# Patient Record
Sex: Female | Born: 1985
Health system: Southern US, Community
[De-identification: ages and names within clinical notes are randomized; demographics above are authoritative.]

## PROBLEM LIST (undated history)

## (undated) DIAGNOSIS — Z789 Other specified health status: Secondary | ICD-10-CM

## (undated) HISTORY — PX: NO PAST SURGERIES: SHX2092

---

## 2008-02-22 ENCOUNTER — Inpatient Hospital Stay (HOSPITAL_COMMUNITY): Admission: AD | Admit: 2008-02-22 | Discharge: 2008-02-22 | Payer: Self-pay | Admitting: Obstetrics & Gynecology

## 2011-02-17 LAB — URINALYSIS, ROUTINE W REFLEX MICROSCOPIC
Bilirubin Urine: NEGATIVE
Glucose, UA: NEGATIVE
Ketones, ur: NEGATIVE
Nitrite: NEGATIVE
Protein, ur: NEGATIVE
Specific Gravity, Urine: 1.025
Urobilinogen, UA: 0.2
pH: 6

## 2011-02-17 LAB — WET PREP, GENITAL: Clue Cells Wet Prep HPF POC: NONE SEEN

## 2011-02-17 LAB — CBC
Hemoglobin: 13.7
MCV: 83.4
Platelets: 472 — ABNORMAL HIGH
RDW: 12.9

## 2011-02-17 LAB — DIFFERENTIAL
Eosinophils Absolute: 0.3
Monocytes Absolute: 0.6

## 2012-12-20 ENCOUNTER — Encounter (HOSPITAL_COMMUNITY): Payer: Self-pay | Admitting: *Deleted

## 2012-12-20 ENCOUNTER — Emergency Department (HOSPITAL_COMMUNITY)
Admission: AD | Admit: 2012-12-20 | Discharge: 2012-12-20 | Disposition: A | Discharge status: Home / Self Care | Payer: Managed Care, Other (non HMO) | Source: Ambulatory Visit | Attending: Emergency Medicine | Admitting: Emergency Medicine

## 2012-12-20 ENCOUNTER — Emergency Department (HOSPITAL_COMMUNITY): Payer: Managed Care, Other (non HMO)

## 2012-12-20 DIAGNOSIS — J40 Bronchitis, not specified as acute or chronic: Secondary | ICD-10-CM | POA: Insufficient documentation

## 2012-12-20 DIAGNOSIS — Z3202 Encounter for pregnancy test, result negative: Secondary | ICD-10-CM | POA: Insufficient documentation

## 2012-12-20 DIAGNOSIS — R7981 Abnormal blood-gas level: Secondary | ICD-10-CM

## 2012-12-20 DIAGNOSIS — R0602 Shortness of breath: Secondary | ICD-10-CM

## 2012-12-20 DIAGNOSIS — R062 Wheezing: Secondary | ICD-10-CM | POA: Insufficient documentation

## 2012-12-20 DIAGNOSIS — R000 Tachycardia, unspecified: Secondary | ICD-10-CM | POA: Insufficient documentation

## 2012-12-20 DIAGNOSIS — J9801 Acute bronchospasm: Secondary | ICD-10-CM

## 2012-12-20 HISTORY — DX: Other specified health status: Z78.9

## 2012-12-20 LAB — URINE MICROSCOPIC-ADD ON

## 2012-12-20 LAB — URINALYSIS, ROUTINE W REFLEX MICROSCOPIC
Glucose, UA: NEGATIVE mg/dL
Ketones, ur: NEGATIVE mg/dL
Nitrite: NEGATIVE
Urobilinogen, UA: 0.2 mg/dL (ref 0.0–1.0)

## 2012-12-20 LAB — D-DIMER, QUANTITATIVE: D-Dimer, Quant: 0.28 ug/mL-FEU (ref 0.00–0.48)

## 2012-12-20 LAB — GLUCOSE, CAPILLARY: Glucose-Capillary: 123 mg/dL — ABNORMAL HIGH (ref 70–99)

## 2012-12-20 LAB — CBC
HCT: 37.4 % (ref 36.0–46.0)
Hemoglobin: 13.3 g/dL (ref 12.0–15.0)
MCHC: 35.6 g/dL (ref 30.0–36.0)
MCV: 81.3 fL (ref 78.0–100.0)
RBC: 4.6 MIL/uL (ref 3.87–5.11)
RDW: 13.2 % (ref 11.5–15.5)

## 2012-12-20 LAB — POCT PREGNANCY, URINE: Preg Test, Ur: NEGATIVE

## 2012-12-20 LAB — BASIC METABOLIC PANEL
Chloride: 101 mEq/L (ref 96–112)
Creatinine, Ser: 0.49 mg/dL — ABNORMAL LOW (ref 0.50–1.10)
GFR calc Af Amer: >90 mL/min (ref >90)
Glucose, Bld: 105 mg/dL — ABNORMAL HIGH (ref 70–99)

## 2012-12-20 MED ORDER — IPRATROPIUM BROMIDE 0.02 % IN SOLN
RESPIRATORY_TRACT | Status: AC
  Administered 2012-12-20: 0.5 mg via RESPIRATORY_TRACT
  Filled 2012-12-20: qty 2.5

## 2012-12-20 MED ORDER — ALBUTEROL SULFATE (5 MG/ML) 0.5% IN NEBU
INHALATION_SOLUTION | RESPIRATORY_TRACT | Status: AC
  Administered 2012-12-20: 2.5 mg via RESPIRATORY_TRACT
  Filled 2012-12-20: qty 0.5

## 2012-12-20 MED ORDER — ALBUTEROL SULFATE (5 MG/ML) 0.5% IN NEBU
2.5000 mg | INHALATION_SOLUTION | Freq: Once | RESPIRATORY_TRACT | Status: AC
  Administered 2012-12-20: 2.5 mg via RESPIRATORY_TRACT

## 2012-12-20 MED ORDER — ALBUTEROL SULFATE HFA 108 (90 BASE) MCG/ACT IN AERS
2.0000 | INHALATION_SPRAY | Freq: Once | RESPIRATORY_TRACT | Status: AC
  Administered 2012-12-20: 2 via RESPIRATORY_TRACT
  Filled 2012-12-20 (×2): qty 6.7

## 2012-12-20 MED ORDER — ACETAMINOPHEN 325 MG PO TABS
650.0000 mg | ORAL_TABLET | Freq: Once | ORAL | Status: AC
  Administered 2012-12-20: 650 mg via ORAL
  Filled 2012-12-20: qty 2

## 2012-12-20 MED ORDER — ALBUTEROL SULFATE (5 MG/ML) 0.5% IN NEBU: 5.0000 mg | INHALATION_SOLUTION | Freq: Once | RESPIRATORY_TRACT | Status: DC

## 2012-12-20 MED ORDER — IPRATROPIUM BROMIDE 0.02 % IN SOLN
0.5000 mg | Freq: Once | RESPIRATORY_TRACT | Status: AC
  Administered 2012-12-20: 0.5 mg via RESPIRATORY_TRACT

## 2012-12-20 MED ORDER — SODIUM CHLORIDE 0.9 % IV SOLN
INTRAVENOUS | Status: DC
  Administered 2012-12-20: 06:00:00 via INTRAVENOUS

## 2012-12-20 NOTE — ED Provider Notes (Signed)
Medical screening examination/treatment/procedure(s) were performed by non-physician practitioner and as supervising physician I was immediately available for consultation/collaboration.    Shanna Cisco, MD 12/20/12 [phone]

## 2012-12-20 NOTE — ED Provider Notes (Signed)
Medical screening examination/treatment/procedure(s) were performed by non-physician practitioner and as supervising physician I was immediately available for consultation/collaboration.   1. SOB (shortness of breath)   2. Borderline low O2 saturation   3. Bronchitis   4. Bronchospasm      Shanna Cisco, MD 12/20/12 [phone]

## 2012-12-20 NOTE — ED Notes (Signed)
Pt ambulated sats remained in 96-98 o2 HR 93

## 2012-12-20 NOTE — MAU Provider Note (Signed)
History     CSN: [id number]  Arrival date and time: 12/20/12 4098   First Provider Initiated Contact with Patient 12/20/12 0501      No chief complaint on file.  HPI Ms. Bianca Ortiz is a 27 y.o. [phone] who presents to MAU today with complaint of upper abdominal pain and SOB. The patient states that the epigastric pain started ~ 5 pm last night. She then became short of breath and has had difficulty breathing since then. She attempted to lay down and wasn't able to breath well so she came in for evaluation. She denies fever, history of asthma or similar previous episode.   OB History   Grav Para Term Preterm Abortions TAB SAB Ect Mult Living   4 3 3  1 1    3       Past Medical History  Diagnosis Date  . Medical history non-contributory     Past Surgical History  Procedure Laterality Date  . No past surgeries      History reviewed. No pertinent family history.  History  Substance Use Topics  . Smoking status: Never Smoker   . Smokeless tobacco: Not on file  . Alcohol Use: Not on file    Allergies: No Known Allergies  No prescriptions prior to admission    Review of Systems  Constitutional: Negative for fever and malaise/fatigue.  Respiratory: Positive for shortness of breath and wheezing.   Gastrointestinal: Positive for abdominal pain.   Physical Exam   Blood pressure 106/66, pulse 109, temperature 98.5 F (36.9 C), resp. rate 18, height 4' 9.5" (1.461 m), weight 186 lb (84.369 kg), last menstrual period 11/15/2012, SpO2 100.00%.  Physical Exam  Constitutional: She is oriented to person, place, and time. She appears well-developed and well-nourished. She appears distressed.  HENT:  Head: Normocephalic and atraumatic.  Cardiovascular: Normal rate, regular rhythm and normal heart sounds.   Respiratory: Tachypnea noted. No respiratory distress. She has wheezes.  GI: Soft. She exhibits no distension and no mass. There is no tenderness. There is no rebound and  no guarding.  Neurological: She is alert and oriented to person, place, and time.  Skin: Skin is warm. She is diaphoretic. There is pallor.  Psychiatric: She has a normal mood and affect.   Results for orders placed during the hospital encounter of 12/20/12 (from the past 24 hour(s))  URINALYSIS, ROUTINE W REFLEX MICROSCOPIC     Status: Abnormal   Collection Time    12/20/12  4:44 AM      Result Value Range   Color, Urine YELLOW  YELLOW   APPearance HAZY (*) CLEAR   Specific Gravity, Urine >1.030 (*) 1.005 - 1.030   pH 6.0  5.0 - 8.0   Glucose, UA NEGATIVE  NEGATIVE mg/dL   Hgb urine dipstick MODERATE (*) NEGATIVE   Bilirubin Urine NEGATIVE  NEGATIVE   Ketones, ur NEGATIVE  NEGATIVE mg/dL   Protein, ur NEGATIVE  NEGATIVE mg/dL   Urobilinogen, UA 0.2  0.0 - 1.0 mg/dL   Nitrite NEGATIVE  NEGATIVE   Leukocytes, UA SMALL (*) NEGATIVE  URINE MICROSCOPIC-ADD ON     Status: Abnormal   Collection Time    12/20/12  4:44 AM      Result Value Range   Squamous Epithelial / LPF FEW (*) RARE   WBC, UA 3-6  <3 WBC/hpf   RBC / HPF 0-2  <3 RBC/hpf   Bacteria, UA FEW (*) RARE   Urine-Other MUCOUS PRESENT    POCT  PREGNANCY, URINE     Status: None   Collection Time    12/20/12  4:53 AM      Result Value Range   Preg Test, Ur NEGATIVE  NEGATIVE  GLUCOSE, CAPILLARY     Status: Abnormal   Collection Time    12/20/12  5:52 AM      Result Value Range   Glucose-Capillary 123 (*) 70 - 99 mg/dL    MAU Course  Procedures None  MDM 0500 - patient is alert and able to answer questions. O2 sat is 94% on room air. Nasal canula placed at 3L/min and O2 sats rise to 99% Patient received nebulizer treatment with albuterol and atrovent by RT. Patient reports some improvement in symptoms EKG ordered - sinus tachycardia noted CBG - 126 Patient reports improvement in SOB. Epigastric discomfort. Denies nausea Patient is weaned to 2 L O2 and maintains 100% O2 sat Patient is weaned off of O2 nasal canula  and maintains 98% O2 sat Patient now denies nausea or epigastric pain or SOB Patient on RA x ~ 15 minutes. Upon re-evaluation and talking with patient O2 sat dropped to 94% again. Replaced nasal canula on 2L/min Discussed patient with Dr. Lynelle Doctor at [facility]. OK for transfer for further evaluation and managament Assessment and Plan  A: SOB Low O2 saturation, unable to wean from O2 nasal canula  P: Transfer to [facility] for further evaluation and management  Freddi Starr, PA-C  12/20/2012, 6:06 AM

## 2012-12-20 NOTE — ED Notes (Signed)
Pt was transferred to this facility from [facility] hospital with sudden onset of  SHOB since 1700  On 8.4.14

## 2012-12-20 NOTE — MAU Note (Addendum)
PT SAYS SHE STARTED  HAVING UPPER ABD PAIN AT 5PM-  DENIES N/V.    COULD NOT SLEEP LYING DOWN-  HAD TO SIT UP.   NEVER HAPPENED BEFORE     SAYS HAD VAG BLEEDING 7-1  THRU 7-27.    NO HOME PREG TEST. LAST SEX-  7-17.   USES CONDOMS FOR BIRTH CONTROL.    SAYS HAS A HARD TIME BREATHING SINCE 5PM, NO COUGH, NO FEVER.    HAD CYCLE 7-1 TRU 5,  THEN HAD SPOTTING  ON 7-27.

## 2012-12-20 NOTE — ED Provider Notes (Signed)
CSN: [id number]     Arrival date & time 12/20/12  0426 History     First MD Initiated Contact with Patient 12/20/12 [phone]     Chief Complaint  Patient presents with  . Shortness of Breath   (Consider location/radiation/quality/duration/timing/severity/associated sxs/prior Treatment) HPI Comments: 27 y/o healthy female presents to the ED from [facility] complaining of sudden onset mid-epigastric pain and shortness of breath beginning around 5:00 pm last night. Shortness of breath remained constant, was unable to lay down to sleep and went to [facility] for evaluation. There she had two albuterol treatments which provided great relief. Denies chest pain, nausea, vomiting, lightheadedness, dizziness, calf pain or swelling. [facility] there was wheezing on exam, appeared in distress, O2 sat would drop to 94% despite treatment. Pregnancy test negative, GBC 123, UA without infection. EKG obtained at [facility] sinus tachycardia, otherwise normal. No history of asthma. Denies family history of blood clots. She does not take birth control pills. 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  Patient is a 27 y.o. female presenting with shortness of breath. The history is provided by the patient.  Shortness of Breath Associated symptoms: wheezing   Associated symptoms: no chest pain, no diaphoresis, no fever and no vomiting     Past Medical History  Diagnosis Date  . Medical history non-contributory    Past Surgical History  Procedure Laterality Date  . No past surgeries     History reviewed. No pertinent family history. History  Substance Use Topics  . Smoking status: Never Smoker   . Smokeless tobacco: Not on file  . Alcohol Use: Not on file   OB History   Grav Para Term Preterm Abortions TAB SAB Ect Mult Living   4 3 3  1 1    3      Review of Systems  Constitutional: Negative for fever, chills and  diaphoresis.  Respiratory: Positive for shortness of breath and wheezing. Negative for chest tightness.   Cardiovascular: Negative for chest pain.  Gastrointestinal: Negative for nausea and vomiting.  Musculoskeletal: Negative for back pain.  Neurological: Negative for dizziness, weakness and light-headedness.  All other systems reviewed and are negative.    Allergies  Review of patient's allergies indicates no known allergies.  Home Medications  No current outpatient prescriptions on file. BP 131/66  Pulse 97  Temp(Src) 98.4 F (36.9 C) (Oral)  Resp 16  Ht 4\' 9"  (1.448 m)  Wt 186 lb (84.369 kg)  BMI 40.24 kg/m2  SpO2 100%  LMP 11/15/2012 Physical Exam  Nursing note and vitals reviewed. Constitutional: She is oriented to person, place, and time. She appears well-developed and well-nourished. No distress. Nasal cannula in place.  HENT:  Head: Normocephalic and atraumatic.  Mouth/Throat: Oropharynx is clear and moist.  Eyes: Conjunctivae and EOM are normal. Pupils are equal, round, and reactive to light.  Neck: Normal range of motion. Neck supple. No tracheal deviation present.  Cardiovascular: Regular rhythm and normal heart sounds.  Tachycardia present.   Pulmonary/Chest: Effort normal. Not tachypneic. No respiratory distress. She has no decreased breath sounds. She has no wheezes. She has no rhonchi. She has no rales.  Abdominal: Soft. Bowel sounds are normal. She exhibits no distension. There is no tenderness.  Musculoskeletal: Normal range of motion. She exhibits no edema.  Neurological: She is alert and oriented to person, place, and time.  Skin: Skin is warm  and dry. She is not diaphoretic. No pallor.  Psychiatric: She has a normal mood and affect. Her behavior is normal.    ED Course   Procedures (including critical care time)  Labs Reviewed  URINALYSIS, ROUTINE W REFLEX MICROSCOPIC - Abnormal; Notable for the following:    APPearance HAZY (*)    Specific  Gravity, Urine >1.030 (*)    Hgb urine dipstick MODERATE (*)    Leukocytes, UA SMALL (*)    All other components within normal limits  URINE MICROSCOPIC-ADD ON - Abnormal; Notable for the following:    Squamous Epithelial / LPF FEW (*)    Bacteria, UA FEW (*)    All other components within normal limits  GLUCOSE, CAPILLARY - Abnormal; Notable for the following:    Glucose-Capillary 123 (*)    All other components within normal limits  CBC - Abnormal; Notable for the following:    WBC 16.2 (*)    All other components within normal limits  BASIC METABOLIC PANEL - Abnormal; Notable for the following:    Glucose, Bld 105 (*)    Creatinine, Ser 0.49 (*)    All other components within normal limits  URINE CULTURE  D-DIMER, QUANTITATIVE  POCT PREGNANCY, URINE    Dg Chest 2 View  12/20/2012   *RADIOLOGY REPORT*  Clinical Data: Shortness of breath.  CHEST - 2 VIEW  Comparison: None  Findings: Heart and mediastinal contours are within normal limits. No focal opacities or effusions.  No acute bony abnormality.  Mild central peribronchial thickening.  IMPRESSION: Mild bronchitic changes.   Original Report Authenticated By: Charlett Nose, M.D.   Dx: bronchitis, bronchospasm.  MDM  Patient transferred from [facility] hospital with SOB. Tachycardia and tachypnea noted at [facility], tachypnea resolved prior to transfer. O2 sat on 2L 99%. She appears in NAD, speaking in full sentences. Lungs clear. Recent long car ride to the beach. Labs pending- d-dimer, cbc, bmp. Will get CXR. Possible PE vs bronchospasm.  8:50 AM D-dimer negative. Chest x-ray showing mild bronchitic changes. Patient is feeling much better. O2 sat on room air 100%. I will have the patient walked and check her O2 sat at that time. If she is able to tolerate, will be discharged home. WIll discharge with albuterol inhaler. 9:13 AM O2 sat 97-99% on RA while talking around the ED. She is stable for discharge. Normal vital signs. Close return  precautions given. Conservative measures discussed. Patient states understanding of treatment care plan and is agreeable.   Trevor Mace, PA-C 12/20/12 [phone]

## 2012-12-21 LAB — URINE CULTURE: Colony Count: >=100000

## 2012-12-21 NOTE — MAU Provider Note (Signed)
Attestation of Attending Supervision of Advanced Practitioner (CNM/NP): Evaluation and management procedures were performed by the Advanced Practitioner under my supervision and collaboration.  I have reviewed the Advanced Practitioner's note and chart, and I agree with the management and plan.  Neymar Dowe 12/21/2012 11:01 AM

## 2013-02-20 ENCOUNTER — Other Ambulatory Visit (HOSPITAL_COMMUNITY): Payer: Self-pay | Admitting: Physician Assistant

## 2013-02-20 DIAGNOSIS — Z3689 Encounter for other specified antenatal screening: Secondary | ICD-10-CM

## 2013-02-20 LAB — OB RESULTS CONSOLE RPR: RPR: NONREACTIVE

## 2013-02-20 LAB — OB RESULTS CONSOLE ANTIBODY SCREEN: Antibody Screen: NEGATIVE

## 2013-02-20 LAB — OB RESULTS CONSOLE ABO/RH: RH TYPE: POSITIVE

## 2013-02-20 LAB — OB RESULTS CONSOLE RUBELLA ANTIBODY, IGM: RUBELLA: IMMUNE

## 2013-02-20 LAB — OB RESULTS CONSOLE HEPATITIS B SURFACE ANTIGEN: Hepatitis B Surface Ag: NEGATIVE

## 2013-02-20 LAB — OB RESULTS CONSOLE HIV ANTIBODY (ROUTINE TESTING): HIV: NONREACTIVE

## 2013-04-24 ENCOUNTER — Ambulatory Visit (HOSPITAL_COMMUNITY)
Admission: RE | Admit: 2013-04-24 | Discharge: 2013-04-24 | Disposition: A | Discharge status: Home / Self Care | Payer: Managed Care, Other (non HMO) | Source: Ambulatory Visit | Attending: Physician Assistant | Admitting: Physician Assistant

## 2013-04-24 DIAGNOSIS — Z3689 Encounter for other specified antenatal screening: Secondary | ICD-10-CM | POA: Insufficient documentation

## 2013-05-26 ENCOUNTER — Other Ambulatory Visit (HOSPITAL_COMMUNITY): Payer: Self-pay | Admitting: Physician Assistant

## 2013-05-26 DIAGNOSIS — IMO0002 Reserved for concepts with insufficient information to code with codable children: Secondary | ICD-10-CM

## 2013-05-26 DIAGNOSIS — Z0489 Encounter for examination and observation for other specified reasons: Secondary | ICD-10-CM

## 2013-05-29 ENCOUNTER — Encounter (HOSPITAL_COMMUNITY): Payer: Self-pay

## 2013-05-29 ENCOUNTER — Ambulatory Visit (HOSPITAL_COMMUNITY)
Admission: RE | Admit: 2013-05-29 | Discharge: 2013-05-29 | Disposition: A | Discharge status: Home / Self Care | Payer: Medicaid Other | Source: Ambulatory Visit | Attending: Physician Assistant | Admitting: Physician Assistant

## 2013-05-29 DIAGNOSIS — Z0489 Encounter for examination and observation for other specified reasons: Secondary | ICD-10-CM

## 2013-05-29 DIAGNOSIS — E669 Obesity, unspecified: Secondary | ICD-10-CM | POA: Insufficient documentation

## 2013-05-29 DIAGNOSIS — IMO0002 Reserved for concepts with insufficient information to code with codable children: Secondary | ICD-10-CM

## 2013-05-29 DIAGNOSIS — O99215 Obesity complicating the puerperium: Principal | ICD-10-CM

## 2013-05-30 ENCOUNTER — Encounter: Payer: Self-pay | Admitting: *Deleted

## 2013-05-31 ENCOUNTER — Other Ambulatory Visit (HOSPITAL_COMMUNITY): Payer: Self-pay | Admitting: Physician Assistant

## 2013-05-31 DIAGNOSIS — Z3689 Encounter for other specified antenatal screening: Secondary | ICD-10-CM

## 2013-06-26 ENCOUNTER — Other Ambulatory Visit (HOSPITAL_COMMUNITY): Payer: Self-pay | Admitting: Physician Assistant

## 2013-06-26 ENCOUNTER — Ambulatory Visit (HOSPITAL_COMMUNITY)
Admission: RE | Admit: 2013-06-26 | Discharge: 2013-06-26 | Disposition: A | Discharge status: Home / Self Care | Payer: Medicaid Other | Source: Ambulatory Visit | Attending: Physician Assistant | Admitting: Physician Assistant

## 2013-06-26 DIAGNOSIS — Z3689 Encounter for other specified antenatal screening: Secondary | ICD-10-CM | POA: Insufficient documentation

## 2013-06-26 DIAGNOSIS — E669 Obesity, unspecified: Secondary | ICD-10-CM | POA: Insufficient documentation

## 2013-06-26 DIAGNOSIS — O2690 Pregnancy related conditions, unspecified, unspecified trimester: Secondary | ICD-10-CM

## 2013-06-26 DIAGNOSIS — O99210 Obesity complicating pregnancy, unspecified trimester: Principal | ICD-10-CM

## 2013-06-26 NOTE — Progress Notes (Signed)
MFM ultrasound  Indication: 28 yr old [id number] at 70108w0d for follow up ultrasound to complete fetal anatomy. Remote read.  Findings: 1. Single intrauterine pregnancy. 2. Estimated fetal weight is in the 70th%. 3. Posterior placenta without evidence of previa. 4. Normal amniotic fluid volume. 5. Normal transabdominal cervical length. 6. The views of the heart and lips remain limited. 7. The remainder of the limited anatomy survey is normal.  Recommendations: 1. Appropriate fetal growth. 2. Limited anatomy survey: - recommend follow up ultrasound in the [facility] so an MFM can scan or alternatively may obtain a fetal echocardiogram to complete heart anatomy - if views cannot be obtained at that point do not recommend any follow up ultrasounds as it is unlikely views will be completed late in gestation - recommend discuss with patient the limitations of ultrasound in detecting fetal anomalies  Bianca FosterKristen Daphne Karrer, MD

## 2013-06-27 ENCOUNTER — Ambulatory Visit (HOSPITAL_COMMUNITY)
Admission: RE | Admit: 2013-06-27 | Discharge: 2013-06-27 | Disposition: A | Discharge status: Home / Self Care | Payer: Medicaid Other | Source: Ambulatory Visit | Attending: Physician Assistant | Admitting: Physician Assistant

## 2013-06-27 ENCOUNTER — Other Ambulatory Visit (HOSPITAL_COMMUNITY): Payer: Self-pay | Admitting: Obstetrics and Gynecology

## 2013-06-27 DIAGNOSIS — O99210 Obesity complicating pregnancy, unspecified trimester: Principal | ICD-10-CM

## 2013-06-27 DIAGNOSIS — O2690 Pregnancy related conditions, unspecified, unspecified trimester: Secondary | ICD-10-CM

## 2013-06-27 DIAGNOSIS — Z3689 Encounter for other specified antenatal screening: Secondary | ICD-10-CM | POA: Insufficient documentation

## 2013-06-27 DIAGNOSIS — E669 Obesity, unspecified: Secondary | ICD-10-CM | POA: Insufficient documentation

## 2013-07-09 ENCOUNTER — Inpatient Hospital Stay (HOSPITAL_COMMUNITY): Payer: Medicaid Other

## 2013-07-09 ENCOUNTER — Encounter (HOSPITAL_COMMUNITY): Payer: Self-pay

## 2013-07-09 ENCOUNTER — Inpatient Hospital Stay (HOSPITAL_COMMUNITY)
Admission: AD | Admit: 2013-07-09 | Discharge: 2013-07-16 | DRG: 765 | Disposition: A | Discharge status: Home / Self Care | Payer: Medicaid Other | Source: Ambulatory Visit | Attending: Obstetrics & Gynecology | Admitting: Obstetrics & Gynecology

## 2013-07-09 DIAGNOSIS — O99214 Obesity complicating childbirth: Secondary | ICD-10-CM

## 2013-07-09 DIAGNOSIS — E669 Obesity, unspecified: Secondary | ICD-10-CM | POA: Diagnosis present

## 2013-07-09 DIAGNOSIS — O321XX0 Maternal care for breech presentation, not applicable or unspecified: Principal | ICD-10-CM | POA: Diagnosis present

## 2013-07-09 DIAGNOSIS — IMO0002 Reserved for concepts with insufficient information to code with codable children: Secondary | ICD-10-CM | POA: Diagnosis present

## 2013-07-09 DIAGNOSIS — O4290 Premature rupture of membranes, unspecified as to length of time between rupture and onset of labor, unspecified weeks of gestation: Secondary | ICD-10-CM | POA: Diagnosis present

## 2013-07-09 DIAGNOSIS — O42913 Preterm premature rupture of membranes, unspecified as to length of time between rupture and onset of labor, third trimester: Secondary | ICD-10-CM

## 2013-07-09 DIAGNOSIS — O411090 Infection of amniotic sac and membranes, unspecified, unspecified trimester, not applicable or unspecified: Secondary | ICD-10-CM | POA: Diagnosis present

## 2013-07-09 LAB — URINALYSIS, ROUTINE W REFLEX MICROSCOPIC
Bilirubin Urine: NEGATIVE
Bilirubin Urine: NEGATIVE
GLUCOSE, UA: NEGATIVE mg/dL
GLUCOSE, UA: NEGATIVE mg/dL
KETONES UR: NEGATIVE mg/dL
Ketones, ur: 40 mg/dL — AB
LEUKOCYTES UA: NEGATIVE
NITRITE: NEGATIVE
Nitrite: NEGATIVE
PH: 6 (ref 5.0–8.0)
PROTEIN: NEGATIVE mg/dL
Protein, ur: NEGATIVE mg/dL
Specific Gravity, Urine: 1.015 (ref 1.005–1.030)
Urobilinogen, UA: 0.2 mg/dL (ref 0.0–1.0)
Urobilinogen, UA: 0.2 mg/dL (ref 0.0–1.0)
pH: 7 (ref 5.0–8.0)

## 2013-07-09 LAB — URINE MICROSCOPIC-ADD ON

## 2013-07-09 LAB — RPR: RPR Ser Ql: NONREACTIVE

## 2013-07-09 LAB — RAPID URINE DRUG SCREEN, HOSP PERFORMED
Amphetamines: NOT DETECTED
BARBITURATES: NOT DETECTED
Benzodiazepines: NOT DETECTED
Cocaine: NOT DETECTED
Opiates: NOT DETECTED
TETRAHYDROCANNABINOL: NOT DETECTED

## 2013-07-09 LAB — CBC
HCT: 32.1 % — ABNORMAL LOW (ref 36.0–46.0)
Hemoglobin: 10.9 g/dL — ABNORMAL LOW (ref 12.0–15.0)
MCH: 27.7 pg (ref 26.0–34.0)
MCHC: 34 g/dL (ref 30.0–36.0)
MCV: 81.7 fL (ref 78.0–100.0)
PLATELETS: 287 10*3/uL (ref 150–400)
RBC: 3.93 MIL/uL (ref 3.87–5.11)
RDW: 13.7 % (ref 11.5–15.5)
WBC: 14.8 10*3/uL — AB (ref 4.0–10.5)

## 2013-07-09 LAB — WET PREP, GENITAL
CLUE CELLS WET PREP: NONE SEEN
Trich, Wet Prep: NONE SEEN
Yeast Wet Prep HPF POC: NONE SEEN

## 2013-07-09 LAB — ABO/RH: ABO/RH(D): B POS

## 2013-07-09 LAB — POCT FERN TEST: POCT FERN TEST: POSITIVE — AB

## 2013-07-09 MED ORDER — AMOXICILLIN 500 MG PO CAPS
500.0000 mg | ORAL_CAPSULE | Freq: Three times a day (TID) | ORAL | Status: DC
  Administered 2013-07-11 – 2013-07-12 (×5): 500 mg via ORAL
  Filled 2013-07-09 (×7): qty 1

## 2013-07-09 MED ORDER — ACETAMINOPHEN 325 MG PO TABS: 650.0000 mg | ORAL_TABLET | ORAL | Status: DC | PRN

## 2013-07-09 MED ORDER — ZOLPIDEM TARTRATE 5 MG PO TABS: 5.0000 mg | ORAL_TABLET | Freq: Every evening | ORAL | Status: DC | PRN

## 2013-07-09 MED ORDER — PRENATAL MULTIVITAMIN CH
1.0000 | ORAL_TABLET | Freq: Every day | ORAL | Status: DC
  Administered 2013-07-09 – 2013-07-12 (×4): 1 via ORAL
  Filled 2013-07-09 (×6): qty 1

## 2013-07-09 MED ORDER — DOCUSATE SODIUM 100 MG PO CAPS
100.0000 mg | ORAL_CAPSULE | Freq: Every day | ORAL | Status: DC
  Administered 2013-07-09 – 2013-07-12 (×4): 100 mg via ORAL
  Filled 2013-07-09 (×6): qty 1

## 2013-07-09 MED ORDER — LACTATED RINGERS IV SOLN
INTRAVENOUS | Status: DC
  Administered 2013-07-09 – 2013-07-11 (×4): via INTRAVENOUS

## 2013-07-09 MED ORDER — SODIUM CHLORIDE 0.9 % IV SOLN
2.0000 g | Freq: Four times a day (QID) | INTRAVENOUS | Status: AC
  Administered 2013-07-09 – 2013-07-11 (×8): 2 g via INTRAVENOUS
  Filled 2013-07-09 (×8): qty 2000

## 2013-07-09 MED ORDER — MAGNESIUM SULFATE BOLUS VIA INFUSION
4.0000 g | Freq: Once | INTRAVENOUS | Status: AC
  Administered 2013-07-09: 4 g via INTRAVENOUS
  Filled 2013-07-09: qty 500

## 2013-07-09 MED ORDER — SODIUM CHLORIDE 0.9 % IV SOLN
250.0000 mg | Freq: Four times a day (QID) | INTRAVENOUS | Status: AC
  Administered 2013-07-09 – 2013-07-11 (×8): 250 mg via INTRAVENOUS
  Filled 2013-07-09 (×8): qty 5

## 2013-07-09 MED ORDER — ERYTHROMYCIN BASE 250 MG PO TABS
250.0000 mg | ORAL_TABLET | Freq: Four times a day (QID) | ORAL | Status: DC
  Administered 2013-07-11 – 2013-07-12 (×6): 250 mg via ORAL
  Filled 2013-07-09 (×9): qty 1

## 2013-07-09 MED ORDER — CALCIUM CARBONATE ANTACID 500 MG PO CHEW
2.0000 | CHEWABLE_TABLET | ORAL | Status: DC | PRN
  Filled 2013-07-09: qty 2

## 2013-07-09 MED ORDER — MAGNESIUM SULFATE 40 G IN LACTATED RINGERS - SIMPLE
2.0000 g/h | INTRAVENOUS | Status: AC
  Filled 2013-07-09: qty 500

## 2013-07-09 MED ORDER — BETAMETHASONE SOD PHOS & ACET 6 (3-3) MG/ML IJ SUSP
12.0000 mg | INTRAMUSCULAR | Status: AC
  Administered 2013-07-09 – 2013-07-10 (×2): 12 mg via INTRAMUSCULAR
  Filled 2013-07-09 (×2): qty 2

## 2013-07-09 NOTE — H&P (Signed)
HPI: Bianca Ortiz is a 28 y.o. year old G49P3013 female at [redacted]w[redacted]d weeks gestation be LMP, verified by 36  Week [place] who presents to MAU reporting spotting and leaking clear mucus x 2 days. Sx started after IC. Upon Speculum exam she was found to have ruptured membranes w/ scant, pink bloody show. She has been getting care at [facility]. No complications. Hx term SVD x 3.   Maternal Medical History:  Reason for admission: Rupture of membranes and vaginal bleeding.  Nausea.  Contractions: Onset was 2 days ago.      Problem list: PPROM 29.6 Varicella  NI  OB History   Grav Para Term Preterm Abortions TAB SAB Ect Mult Living   5 3 3  1 1    3      Past Medical History  Diagnosis Date  . Medical history non-contributory    Past Surgical History  Procedure Laterality Date  . No past surgeries     Family History: family history is not on file. Social History:  reports that she has never smoked. She does not have any smokeless tobacco history on file. She reports that she does not use illicit drugs. Her alcohol history is not on file.   Prenatal Transfer Tool  Maternal Diabetes: No Genetic Screening: Declined Maternal Ultrasounds/Referrals: Normal Fetal Ultrasounds or other Referrals:  None Maternal Substance Abuse:  No Significant Maternal Medications:  None Significant Maternal Lab Results:  None  Other Comments:  PPROM at 29.6 weeks. Amp/Erythro/BMZ/Mag, GBS pending  Review of Systems  Constitutional: Negative for fever and chills.  Eyes: Negative for blurred vision and double vision.  Gastrointestinal: Negative for nausea, vomiting and abdominal pain.  Genitourinary: Negative for dysuria, urgency, frequency, hematuria and flank pain.  Musculoskeletal: Negative for back pain.  Neurological: Negative for headaches.      Blood pressure 118/78, pulse 100, temperature 98.2 F (36.8 C), resp. rate 18, last menstrual period 12/12/2012. Maternal Exam:  Uterine Assessment: Contraction  strength is mild.  Contraction frequency is rare.   Abdomen: Fundal height is S=D.    Introitus: Normal vulva. Vulva is negative for lesion.  Ferning test: positive.  Amniotic fluid character: clear. Pos pooling of clear fluid mixed w/ mucus and pink blood. Cervix visually closed. Non-friable.  Cervix: Cervix evaluated by sterile speculum exam.     Fetal Exam Fetal Monitor Review: Mode: ultrasound.   Baseline rate: 150.  Variability: moderate (6-25 bpm).   Pattern: accelerations present and no decelerations.    Fetal State Assessment: Category I - tracings are normal.     Physical Exam  Nursing note and vitals reviewed. Constitutional: She is oriented to person, place, and time. She appears well-developed and well-nourished. No distress.  HENT:  Head: Normocephalic.  Eyes: Conjunctivae are normal.  Cardiovascular: Normal rate, regular rhythm and normal heart sounds.   Respiratory: Effort normal and breath sounds normal.  GI: Soft. There is no tenderness.  Genitourinary: Vulva exhibits no lesion.  Musculoskeletal: Normal range of motion. She exhibits no edema and no tenderness.  Neurological: She is alert and oriented to person, place, and time. She has normal reflexes.  Skin: Skin is warm and dry.  Psychiatric: She has a normal mood and affect.    Prenatal labs: ABO, Rh:  B Pos Antibody:  Neg Rubella:  Immune RPR:   NR HBsAg:   Neg HIV:   NR GBS:   Pending Quad screen ? Drawn 04/17/2014 Early 1 hour GTT 73 ? 28 week GTT  Assessment:  1. Labor: None. PPROM 2. Fetal Wellbeing: Category I  3. Pain Control: None 4. GBS: Pending 5. 29.6 week IUP  Plan:  1. Admit to Antenatal per consult with MD 2. Routine antenatal orders 3. Amp/Erythro 4. BMZ 5. Mag Sulfate for neuro prophylaxis 6. Plans BTL, but may want to discuss at time of delivery depending on baby's condition 7. NICU consult  Dorathy KinsmanSMITH, Vernecia Umble 07/09/2013, 2:03 PM

## 2013-07-09 NOTE — MAU Note (Signed)
Report called to antenatal. Pt will go to room 158

## 2013-07-09 NOTE — MAU Provider Note (Signed)
See H& P.

## 2013-07-09 NOTE — Progress Notes (Signed)
Notified of pt arrival in MAU and assessment. Will see pt

## 2013-07-09 NOTE — H&P (Signed)
Attestation of Attending Supervision of Advanced Practitioner (CNM/NP): Evaluation and management procedures were performed by the Advanced Practitioner under my supervision and collaboration. I have reviewed the Advanced Practitioner's note and chart, and I agree with the management and plan.  Bailynn Dyk H. 4:27 PM   

## 2013-07-09 NOTE — Progress Notes (Signed)
Neonatologist notified that the pt needs a NICU consult.

## 2013-07-09 NOTE — MAU Note (Signed)
Pt presents with complaints of bright, red vaginal bleeding on and off since intercourse 2 days ago. She is now experiencing cramping in her lower abdomen.

## 2013-07-09 NOTE — MAU Provider Note (Signed)
Attestation of Attending Supervision of Advanced Practitioner (CNM/NP): Evaluation and management procedures were performed by the Advanced Practitioner under my supervision and collaboration. I have reviewed the Advanced Practitioner's note and chart, and I agree with the management and plan.  Syncere Eble H. 4:29 PM   

## 2013-07-09 NOTE — Consult Note (Signed)
Neonatology Consult to Antenatal Patient: 07/09/2013 10:22 PM    I was requested by Dr. Penne LashLeggett to see this patient in order to provide antenatal counseling due to PPROM since 07/08/2013.    Ms.Kathan is a 28 y/o G5P3 who was admitted today and is now 29 6/[redacted] weeks GA.   She is currently "not" having active labor.  She is getting BMZ, Magnesium Sulfate and IV antibiotics (Ampicillin and Erythromycin).  EFW 1730 grams.  I spoke with Ms.Shimel in Room 158.   We discussed in detail what to expect in case of possible delivery of the infant in the next few days including morbidity and mortality at this gestational age, usual delivery room resuscitation including intubation and surfactant administration in the DR.  Discussed possible respiratory complications and need for support including mechanical ventilation, IV access, sepsis work-up, NG/OG feedings ( benefits of BF and MOB desires breast feeding, which was encouraged), risk for IVH with the potential for motor/cognitive deficits, length of stay and long-term outcome.  She had a few questions, which I answered.   I offered a NICU tour to any interested family members and would be glad to come back if she has more questions later.  Thank you for asking us to see this patient and allowing us to participate in her care.  Please call is there are any further questions.   Overton MamMary Ann T Nael Petrosyan, MD (Attending Neonatologist)  Total length of face-to-face or floor/unit time for this encounter was 25  minutes. Counseling and/or coordination of care was greater than fifty percent of the time.

## 2013-07-10 DIAGNOSIS — O4290 Premature rupture of membranes, unspecified as to length of time between rupture and onset of labor, unspecified weeks of gestation: Secondary | ICD-10-CM

## 2013-07-10 LAB — GC/CHLAMYDIA PROBE AMP
CT Probe RNA: NEGATIVE
GC Probe RNA: NEGATIVE

## 2013-07-10 NOTE — Progress Notes (Signed)
Ur chart review completed.  

## 2013-07-10 NOTE — Progress Notes (Signed)
Patient ID: Bianca Ortiz, female   DOB: [birth year], 28 y.o.   MRN: [id number] [place]) NOTE  Bianca Ortiz is a 28 y.o. [id number] at [redacted]w[redacted]d who is admitted for PPROM.   Length of Stay:  1  Days  Subjective: Patient reports the fetal movement as active. Patient reports uterine contraction  activity as none. Patient reports  vaginal bleeding as none. Patient describes fluid per vagina as Clear.  Vitals:  Blood pressure 97/58, pulse 96, temperature 98.1 F (36.7 C), temperature source Oral, resp. rate 18, height _0  (1.448 m), weight 196 lb (88.905 kg), last menstrual period 12/12/2012. Physical Examination:  General appearance - alert, well appearing, and in no distress Abdomen - soft, nontender, nondistended, no masses or organomegaly Extremities - Homan's sign negative bilaterally  Fetal Monitoring:  Baseline: 150 bpm, Variability: Good {> 6 bpm), Accelerations: Reactive and Decelerations: Absent  Labs:  Results for orders placed during the hospital encounter of 07/09/13 (from the past 24 hour(s))  URINALYSIS, ROUTINE W REFLEX MICROSCOPIC   Collection Time    07/09/13 11:00 AM      Result Value Ref Range   Color, Urine YELLOW  YELLOW   APPearance CLEAR  CLEAR   Specific Gravity, Urine 1.015  1.005 - 1.030   pH 7.0  5.0 - 8.0   Glucose, UA NEGATIVE  NEGATIVE mg/dL   Hgb urine dipstick MODERATE (*) NEGATIVE   Bilirubin Urine NEGATIVE  NEGATIVE   Ketones, ur NEGATIVE  NEGATIVE mg/dL   Protein, ur NEGATIVE  NEGATIVE mg/dL   Urobilinogen, UA 0.2  0.0 - 1.0 mg/dL   Nitrite NEGATIVE  NEGATIVE   Leukocytes, UA SMALL (*) NEGATIVE  URINE MICROSCOPIC-ADD ON   Collection Time    07/09/13 11:00 AM      Result Value Ref Range   Squamous Epithelial / LPF FEW (*) RARE   WBC, UA 11-20  <3 WBC/hpf   RBC / HPF 3-6  <3 RBC/hpf   Bacteria, UA MANY (*) RARE  WET PREP, GENITAL   Collection Time    07/09/13  1:04 PM      Result Value Ref Range   Yeast Wet  Prep HPF POC NONE SEEN  NONE SEEN   Trich, Wet Prep NONE SEEN  NONE SEEN   Clue Cells Wet Prep HPF POC NONE SEEN  NONE SEEN   WBC, Wet Prep HPF POC MANY (*) NONE SEEN  URINALYSIS, ROUTINE W REFLEX MICROSCOPIC   Collection Time    07/09/13  2:12 PM      Result Value Ref Range   Color, Urine YELLOW  YELLOW   APPearance CLEAR  CLEAR   Specific Gravity, Urine >1.030 (*) 1.005 - 1.030   pH 6.0  5.0 - 8.0   Glucose, UA NEGATIVE  NEGATIVE mg/dL   Hgb urine dipstick TRACE (*) NEGATIVE   Bilirubin Urine NEGATIVE  NEGATIVE   Ketones, ur 40 (*) NEGATIVE mg/dL   Protein, ur NEGATIVE  NEGATIVE mg/dL   Urobilinogen, UA 0.2  0.0 - 1.0 mg/dL   Nitrite NEGATIVE  NEGATIVE   Leukocytes, UA NEGATIVE  NEGATIVE  URINE MICROSCOPIC-ADD ON   Collection Time    07/09/13  2:12 PM      Result Value Ref Range   Squamous Epithelial / LPF FEW (*) RARE   WBC, UA 0-2  <3 WBC/hpf   RBC / HPF 0-2  <3 RBC/hpf   Bacteria, UA FEW (*) RARE   Urine-Other MUCOUS PRESENT  URINE RAPID DRUG SCREEN (HOSP PERFORMED)   Collection Time    07/09/13  2:12 PM      Result Value Ref Range   Opiates NONE DETECTED  NONE DETECTED   Cocaine NONE DETECTED  NONE DETECTED   Benzodiazepines NONE DETECTED  NONE DETECTED   Amphetamines NONE DETECTED  NONE DETECTED   Tetrahydrocannabinol NONE DETECTED  NONE DETECTED   Barbiturates NONE DETECTED  NONE DETECTED  RPR   Collection Time    07/09/13  2:20 PM      Result Value Ref Range   RPR NON REACTIVE  NON REACTIVE  CBC   Collection Time    07/09/13  3:10 PM      Result Value Ref Range   WBC 14.8 (*) 4.0 - 10.5 K/uL   RBC 3.93  3.87 - 5.11 MIL/uL   Hemoglobin 10.9 (*) 12.0 - 15.0 g/dL   HCT 32.1 (*) 36.0 - 46.0 %   MCV 81.7  78.0 - 100.0 fL   MCH 27.7  26.0 - 34.0 pg   MCHC 34.0  30.0 - 36.0 g/dL   RDW 13.7  11.5 - 15.5 %   Platelets 287  150 - 400 K/uL  TYPE AND SCREEN   Collection Time    07/09/13  3:10 PM      Result Value Ref Range   ABO/RH(D) B POS     Antibody  Screen NEG     Sample Expiration 07/12/2013    ABO/RH   Collection Time    07/09/13  3:10 PM      Result Value Ref Range   ABO/RH(D) B POS    POCT FERN TEST   Collection Time    07/09/13  4:16 PM      Result Value Ref Range   POCT Fern Test Positive = ruptured amniotic membanes (*)     Medications:  Scheduled . ampicillin (OMNIPEN) IV  2 g Intravenous Q6H   Followed by  . [START ON 07/11/2013] amoxicillin  500 mg Oral Q8H  . betamethasone acetate-betamethasone sodium phosphate  12 mg Intramuscular Q24H  . docusate sodium  100 mg Oral Daily  . erythromycin  250 mg Intravenous Q6H   Followed by  . [START ON 07/11/2013] erythromycin  250 mg Oral Q6H  . prenatal multivitamin  1 tablet Oral Q1200   I have reviewed the patient's current medications. Prior to Admission:  Prescriptions prior to admission  Medication Sig Dispense Refill  . Prenatal Vit-Fe Fumarate-FA (PRENATAL MULTIVITAMIN) TABS tablet Take 1 tablet by mouth daily at 12 noon.        ASSESSMENT: Patient Active Problem List   Diagnosis Date Noted  . Premature rupture of membranes in third trimester 07/09/2013    Plan: Bianca Ortiz is a 28 y.o. [id number] at [redacted]w[redacted]d who is admitted for PPROM.   1-Due for second dose of steroids 2-s/p Mag--no tocolytics at this time. 3-Pt has met with NICU 4-NST bid 5-Continue latency antibiotics.  Bianca Ortiz. 07/10/2013,6:19 AM

## 2013-07-10 NOTE — Plan of Care (Signed)
Problem: Consults Goal: Birthing Suites Patient Information Press F2 to bring up selections list  Outcome: Completed/Met Date Met:  07/10/13  Pt < [redacted] weeks EGA

## 2013-07-11 LAB — CULTURE, BETA STREP (GROUP B ONLY)

## 2013-07-11 MED ORDER — SODIUM CHLORIDE 0.9 % IJ SOLN: 3.0000 mL | Freq: Two times a day (BID) | INTRAMUSCULAR | Status: DC

## 2013-07-11 NOTE — Progress Notes (Signed)
Patient ID: Bianca Ortiz Gavina, female   DOB: 11/30/1985, 28 y.o.   MRN: [id number] FACULTY PRACTICE ANTEPARTUM(COMPREHENSIVE) NOTE  Bianca Ortiz Hickling is a 28 y.o. [id number] at 7153w1d  who is admitted for PPROM.   Length of Stay:  2  Days  Subjective: Patient reports the fetal movement as active. Patient reports uterine contraction  activity as none. Patient reports  vaginal bleeding as none. Patient describes fluid per vagina as Clear.  Vitals:  Blood pressure 102/51, pulse 72, temperature 97.9 F (36.6 C), temperature source Oral, resp. rate 16, height 4\' 9"  (1.448 m), weight 196 lb (88.905 kg), last menstrual period 12/12/2012. Physical Examination:  General appearance - alert, well appearing, and in no distress Abdomen - soft, nontender, nondistended, no masses or organomegaly Extremities - Homan's sign negative bilaterally  Fetal Monitoring:  Baseline: 150 bpm, Variability: Good {> 6 bpm), Accelerations: Reactive and Decelerations: Absent  Labs:  No results found for this or any previous visit (from the past 24 hour(s)).  Medications:  Scheduled . ampicillin (OMNIPEN) IV  2 g Intravenous Q6H   Followed by  . amoxicillin  500 mg Oral Q8H  . docusate sodium  100 mg Oral Daily  . erythromycin  250 mg Intravenous Q6H   Followed by  . erythromycin  250 mg Oral Q6H  . prenatal multivitamin  1 tablet Oral Q1200   I have reviewed the patient's current medications. Prior to Admission:  Prescriptions prior to admission  Medication Sig Dispense Refill  . Prenatal Vit-Fe Fumarate-FA (PRENATAL MULTIVITAMIN) TABS tablet Take 1 tablet by mouth daily at 12 noon.        ASSESSMENT: Patient Active Problem List   Diagnosis Date Noted  . Premature rupture of membranes in third trimester 07/09/2013    Plan: Bianca Ortiz Koslow is a 28 y.o. [id number] at 3253w1d  who is admitted for PPROM, s/p BMZ and MgSO4 Continue latency antibiotics Continue monitoring for si/sx of chorio Continue current antepartum  care  Emmaus Brandi 07/11/2013,6:56 AM

## 2013-07-12 LAB — TYPE AND SCREEN
ABO/RH(D): B POS
ABO/RH(D): B POS
ANTIBODY SCREEN: NEGATIVE
Antibody Screen: NEGATIVE

## 2013-07-12 MED ORDER — LIDOCAINE 1%/NA BICARB 0.1 MEQ INJECTION
INJECTION | INTRAVENOUS | Status: AC
  Filled 2013-07-12: qty 1

## 2013-07-12 MED ORDER — LACTATED RINGERS IV SOLN
INTRAVENOUS | Status: DC
  Administered 2013-07-12 – 2013-07-13 (×3): via INTRAVENOUS

## 2013-07-12 MED ORDER — NIFEDIPINE 10 MG PO CAPS
10.0000 mg | ORAL_CAPSULE | Freq: Once | ORAL | Status: AC
  Administered 2013-07-12: 10 mg via ORAL
  Filled 2013-07-12: qty 1

## 2013-07-12 NOTE — Progress Notes (Signed)
Patient ID: Bianca GordonCynthia Ortiz, female   DOB: [birth year], 28 y.o.   MRN: [id number] FACULTY PRACTICE ANTEPARTUM(COMPREHENSIVE) NOTE  Bianca GordonCynthia Ortiz is a 28 y.o. [id number] at 3839w2d by best clinical estimate who is admitted for PROM.   Fetal presentation is cephalic. Length of Stay:  3  Days  Subjective: Doing well Patient reports the fetal movement as active. Patient reports uterine contraction  activity as none. Patient reports  vaginal bleeding as scant staining. Patient describes fluid per vagina as Clear.  Vitals:  Blood pressure 115/73, pulse 85, temperature 98.5 F (36.9 C), temperature source Oral, resp. rate 18, height 4\' 9"  (1.448 m), weight 196 lb (88.905 kg), last menstrual period 12/12/2012. Physical Examination:  General appearance - alert, well appearing, and in no distress Fundal Height:  size equals dates Gravid, non-tender Extremities: extremities normal, atraumatic, no cyanosis or edema Membranes:ruptured, clear fluid  Fetal Monitoring:  Baseline: 150 bpm, Variability: Good {> 6 bpm), Accelerations: Reactive and Decelerations: Absent  Labs:  No results found for this or any previous visit (from the past 24 hour(s)).    Medications:  Scheduled . amoxicillin  500 mg Oral Q8H  . docusate sodium  100 mg Oral Daily  . erythromycin  250 mg Oral Q6H  . prenatal multivitamin  1 tablet Oral Q1200  . sodium chloride  3 mL Intravenous Q12H   I have reviewed the patient's current medications.  ASSESSMENT: Patient Active Problem List   Diagnosis Date Noted  . Premature rupture of membranes in third trimester 07/09/2013    PLAN: Cont. Inpt. Monitoring Delivery with s/sx's chorio On Po Abx.  Kristie Bracewell S 07/12/2013,7:10 AM

## 2013-07-13 ENCOUNTER — Inpatient Hospital Stay (HOSPITAL_COMMUNITY): Payer: Medicaid Other | Admitting: Anesthesiology

## 2013-07-13 ENCOUNTER — Encounter (HOSPITAL_COMMUNITY)
Admission: AD | Disposition: A | Discharge status: Home / Self Care | Payer: Self-pay | Source: Ambulatory Visit | Attending: Obstetrics & Gynecology

## 2013-07-13 ENCOUNTER — Encounter (HOSPITAL_COMMUNITY): Payer: Medicaid Other | Admitting: Anesthesiology

## 2013-07-13 ENCOUNTER — Encounter (HOSPITAL_COMMUNITY): Payer: Self-pay | Admitting: *Deleted

## 2013-07-13 DIAGNOSIS — O4290 Premature rupture of membranes, unspecified as to length of time between rupture and onset of labor, unspecified weeks of gestation: Secondary | ICD-10-CM

## 2013-07-13 DIAGNOSIS — O411090 Infection of amniotic sac and membranes, unspecified, unspecified trimester, not applicable or unspecified: Secondary | ICD-10-CM

## 2013-07-13 DIAGNOSIS — O321XX0 Maternal care for breech presentation, not applicable or unspecified: Secondary | ICD-10-CM | POA: Diagnosis not present

## 2013-07-13 LAB — CBC
HCT: 29.2 % — ABNORMAL LOW (ref 36.0–46.0)
HEMATOCRIT: 32.5 % — AB (ref 36.0–46.0)
HEMOGLOBIN: 11.1 g/dL — AB (ref 12.0–15.0)
Hemoglobin: 9.8 g/dL — ABNORMAL LOW (ref 12.0–15.0)
MCH: 27.6 pg (ref 26.0–34.0)
MCH: 28.1 pg (ref 26.0–34.0)
MCHC: 33.6 g/dL (ref 30.0–36.0)
MCHC: 34.2 g/dL (ref 30.0–36.0)
MCV: 82.3 fL (ref 78.0–100.0)
MCV: 82.3 fL (ref 78.0–100.0)
Platelets: 261 10*3/uL (ref 150–400)
Platelets: 293 10*3/uL (ref 150–400)
RBC: 3.55 MIL/uL — ABNORMAL LOW (ref 3.87–5.11)
RBC: 3.95 MIL/uL (ref 3.87–5.11)
RDW: 13.7 % (ref 11.5–15.5)
RDW: 13.9 % (ref 11.5–15.5)
WBC: 15.5 10*3/uL — AB (ref 4.0–10.5)
WBC: 16.5 10*3/uL — AB (ref 4.0–10.5)

## 2013-07-13 SURGERY — Surgical Case
Anesthesia: Spinal | Site: Abdomen

## 2013-07-13 MED ORDER — NALBUPHINE HCL 10 MG/ML IJ SOLN: 5.0000 mg | INTRAMUSCULAR | Status: DC | PRN

## 2013-07-13 MED ORDER — KETOROLAC TROMETHAMINE 30 MG/ML IJ SOLN
INTRAMUSCULAR | Status: AC
  Filled 2013-07-13: qty 1

## 2013-07-13 MED ORDER — PRENATAL MULTIVITAMIN CH
1.0000 | ORAL_TABLET | Freq: Every day | ORAL | Status: DC
  Administered 2013-07-13 – 2013-07-16 (×4): 1 via ORAL
  Filled 2013-07-13 (×4): qty 1

## 2013-07-13 MED ORDER — DIPHENHYDRAMINE HCL 50 MG/ML IJ SOLN: 25.0000 mg | INTRAMUSCULAR | Status: DC | PRN

## 2013-07-13 MED ORDER — DIPHENHYDRAMINE HCL 25 MG PO CAPS: 25.0000 mg | ORAL_CAPSULE | Freq: Four times a day (QID) | ORAL | Status: DC | PRN

## 2013-07-13 MED ORDER — LACTATED RINGERS IV SOLN
INTRAVENOUS | Status: DC | PRN
  Administered 2013-07-13: 02:00:00 via INTRAVENOUS

## 2013-07-13 MED ORDER — CEFAZOLIN SODIUM-DEXTROSE 2-3 GM-% IV SOLR
2.0000 g | Freq: Once | INTRAVENOUS | Status: AC
  Administered 2013-07-13: 2 g via INTRAVENOUS
  Filled 2013-07-13: qty 50

## 2013-07-13 MED ORDER — ONDANSETRON HCL 4 MG/2ML IJ SOLN: 4.0000 mg | INTRAMUSCULAR | Status: DC | PRN

## 2013-07-13 MED ORDER — FENTANYL CITRATE 0.05 MG/ML IJ SOLN
INTRAMUSCULAR | Status: AC
  Filled 2013-07-13: qty 2

## 2013-07-13 MED ORDER — LANOLIN HYDROUS EX OINT: 1.0000 "application " | TOPICAL_OINTMENT | CUTANEOUS | Status: DC | PRN

## 2013-07-13 MED ORDER — IBUPROFEN 600 MG PO TABS
600.0000 mg | ORAL_TABLET | Freq: Four times a day (QID) | ORAL | Status: DC | PRN
  Filled 2013-07-13: qty 1

## 2013-07-13 MED ORDER — MEPERIDINE HCL 25 MG/ML IJ SOLN
6.2500 mg | INTRAMUSCULAR | Status: DC | PRN
Start: 2013-07-13 — End: 2013-07-13

## 2013-07-13 MED ORDER — BUPIVACAINE IN DEXTROSE 0.75-8.25 % IT SOLN
INTRATHECAL | Status: DC | PRN
  Administered 2013-07-13: 1.2 mL via INTRATHECAL

## 2013-07-13 MED ORDER — PROMETHAZINE HCL 25 MG/ML IJ SOLN: 6.2500 mg | INTRAMUSCULAR | Status: DC | PRN

## 2013-07-13 MED ORDER — MEPERIDINE HCL 25 MG/ML IJ SOLN: 6.2500 mg | INTRAMUSCULAR | Status: DC | PRN

## 2013-07-13 MED ORDER — DIBUCAINE 1 % RE OINT: 1.0000 "application " | TOPICAL_OINTMENT | RECTAL | Status: DC | PRN

## 2013-07-13 MED ORDER — SCOPOLAMINE 1 MG/3DAYS TD PT72
MEDICATED_PATCH | TRANSDERMAL | Status: AC
  Filled 2013-07-13: qty 1

## 2013-07-13 MED ORDER — SENNOSIDES-DOCUSATE SODIUM 8.6-50 MG PO TABS
2.0000 | ORAL_TABLET | ORAL | Status: DC
  Administered 2013-07-13 – 2013-07-15 (×3): 2 via ORAL
  Filled 2013-07-13 (×3): qty 2

## 2013-07-13 MED ORDER — NALOXONE HCL 0.4 MG/ML IJ SOLN
0.4000 mg | INTRAMUSCULAR | Status: DC | PRN
Start: 2013-07-13 — End: 2013-07-16

## 2013-07-13 MED ORDER — ONDANSETRON HCL 4 MG/2ML IJ SOLN: 4.0000 mg | Freq: Three times a day (TID) | INTRAMUSCULAR | Status: DC | PRN

## 2013-07-13 MED ORDER — DIPHENHYDRAMINE HCL 50 MG/ML IJ SOLN: 12.5000 mg | INTRAMUSCULAR | Status: DC | PRN

## 2013-07-13 MED ORDER — OXYTOCIN 40 UNITS IN LACTATED RINGERS INFUSION - SIMPLE MED: 62.5000 mL/h | INTRAVENOUS | Status: AC

## 2013-07-13 MED ORDER — NALOXONE HCL 1 MG/ML IJ SOLN
1.0000 ug/kg/h | INTRAMUSCULAR | Status: DC | PRN
  Filled 2013-07-13: qty 2

## 2013-07-13 MED ORDER — ACETAMINOPHEN 500 MG PO TABS
1000.0000 mg | ORAL_TABLET | Freq: Four times a day (QID) | ORAL | Status: AC
  Administered 2013-07-13 (×3): 1000 mg via ORAL
  Filled 2013-07-13 (×3): qty 2

## 2013-07-13 MED ORDER — SODIUM CHLORIDE 0.9 % IJ SOLN: 3.0000 mL | INTRAMUSCULAR | Status: DC | PRN

## 2013-07-13 MED ORDER — TERBUTALINE SULFATE 1 MG/ML IJ SOLN
INTRAMUSCULAR | Status: AC
  Administered 2013-07-13: 1 mg
  Filled 2013-07-13: qty 1

## 2013-07-13 MED ORDER — SIMETHICONE 80 MG PO CHEW
80.0000 mg | CHEWABLE_TABLET | Freq: Three times a day (TID) | ORAL | Status: DC
  Administered 2013-07-13 – 2013-07-16 (×7): 80 mg via ORAL
  Filled 2013-07-13 (×8): qty 1

## 2013-07-13 MED ORDER — PHENYLEPHRINE 8 MG IN D5W 100 ML (0.08MG/ML) PREMIX OPTIME
INJECTION | INTRAVENOUS | Status: DC | PRN
  Administered 2013-07-13: 45 ug/min via INTRAVENOUS

## 2013-07-13 MED ORDER — ONDANSETRON HCL 4 MG/2ML IJ SOLN
INTRAMUSCULAR | Status: AC
  Filled 2013-07-13: qty 2

## 2013-07-13 MED ORDER — IBUPROFEN 600 MG PO TABS
600.0000 mg | ORAL_TABLET | Freq: Four times a day (QID) | ORAL | Status: DC
  Administered 2013-07-13 – 2013-07-16 (×12): 600 mg via ORAL
  Filled 2013-07-13 (×11): qty 1

## 2013-07-13 MED ORDER — ZOLPIDEM TARTRATE 5 MG PO TABS: 5.0000 mg | ORAL_TABLET | Freq: Every evening | ORAL | Status: DC | PRN

## 2013-07-13 MED ORDER — ONDANSETRON HCL 4 MG/2ML IJ SOLN
INTRAMUSCULAR | Status: DC | PRN
  Administered 2013-07-13: 4 mg via INTRAVENOUS

## 2013-07-13 MED ORDER — ONDANSETRON HCL 4 MG PO TABS: 4.0000 mg | ORAL_TABLET | ORAL | Status: DC | PRN

## 2013-07-13 MED ORDER — MENTHOL 3 MG MT LOZG: 1.0000 | LOZENGE | OROMUCOSAL | Status: DC | PRN

## 2013-07-13 MED ORDER — SIMETHICONE 80 MG PO CHEW
80.0000 mg | CHEWABLE_TABLET | ORAL | Status: DC
  Administered 2013-07-13 – 2013-07-15 (×3): 80 mg via ORAL
  Filled 2013-07-13 (×3): qty 1

## 2013-07-13 MED ORDER — KETOROLAC TROMETHAMINE 30 MG/ML IJ SOLN: 30.0000 mg | Freq: Four times a day (QID) | INTRAMUSCULAR | Status: AC | PRN

## 2013-07-13 MED ORDER — TERBUTALINE SULFATE 1 MG/ML IJ SOLN: 0.2500 mg | Freq: Once | INTRAMUSCULAR | Status: DC

## 2013-07-13 MED ORDER — OXYTOCIN 10 UNIT/ML IJ SOLN
INTRAMUSCULAR | Status: AC
  Filled 2013-07-13: qty 4

## 2013-07-13 MED ORDER — MORPHINE SULFATE 0.5 MG/ML IJ SOLN
INTRAMUSCULAR | Status: AC
  Filled 2013-07-13: qty 10

## 2013-07-13 MED ORDER — SIMETHICONE 80 MG PO CHEW: 80.0000 mg | CHEWABLE_TABLET | ORAL | Status: DC | PRN

## 2013-07-13 MED ORDER — WITCH HAZEL-GLYCERIN EX PADS: 1.0000 "application " | MEDICATED_PAD | CUTANEOUS | Status: DC | PRN

## 2013-07-13 MED ORDER — LACTATED RINGERS IV SOLN
INTRAVENOUS | Status: DC
  Administered 2013-07-13: 10:00:00 via INTRAVENOUS

## 2013-07-13 MED ORDER — MIDAZOLAM HCL 2 MG/2ML IJ SOLN: 0.5000 mg | Freq: Once | INTRAMUSCULAR | Status: DC | PRN

## 2013-07-13 MED ORDER — PHENYLEPHRINE 8 MG IN D5W 100 ML (0.08MG/ML) PREMIX OPTIME
INJECTION | INTRAVENOUS | Status: AC
  Filled 2013-07-13: qty 100

## 2013-07-13 MED ORDER — OXYTOCIN 10 UNIT/ML IJ SOLN
40.0000 [IU] | INTRAVENOUS | Status: DC | PRN
  Administered 2013-07-13: 40 [IU] via INTRAVENOUS

## 2013-07-13 MED ORDER — DIPHENHYDRAMINE HCL 25 MG PO CAPS: 25.0000 mg | ORAL_CAPSULE | ORAL | Status: DC | PRN

## 2013-07-13 MED ORDER — CITRIC ACID-SODIUM CITRATE 334-500 MG/5ML PO SOLN
ORAL | Status: AC
  Administered 2013-07-13: 30 mL
  Filled 2013-07-13: qty 15

## 2013-07-13 MED ORDER — FENTANYL CITRATE 0.05 MG/ML IJ SOLN: 25.0000 ug | INTRAMUSCULAR | Status: DC | PRN

## 2013-07-13 MED ORDER — MORPHINE SULFATE (PF) 0.5 MG/ML IJ SOLN
INTRAMUSCULAR | Status: DC | PRN
  Administered 2013-07-13: .15 mg via INTRATHECAL

## 2013-07-13 MED ORDER — FENTANYL CITRATE 0.05 MG/ML IJ SOLN
INTRAMUSCULAR | Status: DC | PRN
  Administered 2013-07-13: 25 ug via INTRATHECAL

## 2013-07-13 MED ORDER — OXYCODONE-ACETAMINOPHEN 5-325 MG PO TABS
1.0000 | ORAL_TABLET | ORAL | Status: DC | PRN
  Administered 2013-07-14: 1 via ORAL
  Filled 2013-07-13: qty 1

## 2013-07-13 MED ORDER — TETANUS-DIPHTH-ACELL PERTUSSIS 5-2.5-18.5 LF-MCG/0.5 IM SUSP
0.5000 mL | Freq: Once | INTRAMUSCULAR | Status: AC
  Administered 2013-07-14: 0.5 mL via INTRAMUSCULAR

## 2013-07-13 MED ORDER — SCOPOLAMINE 1 MG/3DAYS TD PT72
1.0000 | MEDICATED_PATCH | Freq: Once | TRANSDERMAL | Status: AC
Start: 2013-07-13 — End: 2013-07-16
  Administered 2013-07-13: 1.5 mg via TRANSDERMAL

## 2013-07-13 MED ORDER — METOCLOPRAMIDE HCL 5 MG/ML IJ SOLN: 10.0000 mg | Freq: Three times a day (TID) | INTRAMUSCULAR | Status: DC | PRN

## 2013-07-13 SURGICAL SUPPLY — 31 items
BENZOIN TINCTURE PRP APPL 2/3 (GAUZE/BANDAGES/DRESSINGS) ×3 IMPLANT
CLAMP CORD UMBIL (MISCELLANEOUS) IMPLANT
CLOSURE WOUND 1/2 X4 (GAUZE/BANDAGES/DRESSINGS) ×1
CLOTH BEACON ORANGE TIMEOUT ST (SAFETY) ×3 IMPLANT
DRAPE LG THREE QUARTER DISP (DRAPES) IMPLANT
DRSG OPSITE POSTOP 4X10 (GAUZE/BANDAGES/DRESSINGS) ×3 IMPLANT
DURAPREP 26ML APPLICATOR (WOUND CARE) ×3 IMPLANT
ELECT REM PT RETURN 9FT ADLT (ELECTROSURGICAL) ×3
ELECTRODE REM PT RTRN 9FT ADLT (ELECTROSURGICAL) ×1 IMPLANT
EXTRACTOR VACUUM KIWI (MISCELLANEOUS) IMPLANT
GLOVE BIO SURGEON ST LM GN SZ9 (GLOVE) ×3 IMPLANT
GLOVE BIOGEL PI IND STRL 9 (GLOVE) ×1 IMPLANT
GLOVE BIOGEL PI INDICATOR 9 (GLOVE) ×2
GOWN STRL REUS W/TWL 2XL LVL3 (GOWN DISPOSABLE) ×3 IMPLANT
GOWN STRL REUS W/TWL LRG LVL3 (GOWN DISPOSABLE) ×3 IMPLANT
NEEDLE HYPO 25X5/8 SAFETYGLIDE (NEEDLE) ×3 IMPLANT
NS IRRIG 1000ML POUR BTL (IV SOLUTION) ×3 IMPLANT
PACK C SECTION WH (CUSTOM PROCEDURE TRAY) ×3 IMPLANT
PAD OB MATERNITY 4.3X12.25 (PERSONAL CARE ITEMS) ×3 IMPLANT
RTRCTR C-SECT PINK 25CM LRG (MISCELLANEOUS) ×3 IMPLANT
STRIP CLOSURE SKIN 1/2X4 (GAUZE/BANDAGES/DRESSINGS) ×2 IMPLANT
SUT MON AB-0 CT1 36 (SUTURE) ×9 IMPLANT
SUT VIC AB 0 CT1 27 (SUTURE) ×2
SUT VIC AB 0 CT1 27XBRD ANBCTR (SUTURE) ×1 IMPLANT
SUT VIC AB 2-0 CT1 27 (SUTURE) ×4
SUT VIC AB 2-0 CT1 TAPERPNT 27 (SUTURE) ×2 IMPLANT
SUT VIC AB 4-0 KS 27 (SUTURE) ×3 IMPLANT
SYR BULB IRRIGATION 50ML (SYRINGE) IMPLANT
TOWEL OR 17X24 6PK STRL BLUE (TOWEL DISPOSABLE) ×3 IMPLANT
TRAY FOLEY CATH 14FR (SET/KITS/TRAYS/PACK) ×3 IMPLANT
WATER STERILE IRR 1000ML POUR (IV SOLUTION) IMPLANT

## 2013-07-13 NOTE — Progress Notes (Signed)
UR completed 

## 2013-07-13 NOTE — Progress Notes (Signed)
FACULTY PRACTICE ANTEPARTUM(COMPREHENSIVE) NOTE  Bianca GordonCynthia Ortiz is a [age] y.o. [phone] at 476w3d by early ultrasound who is admitted for PROM.   Fetal presentation is breech. By u/s at bedside by me. Pt is complaining of mild contractions.  Length of Stay:  4  Days  Subjective: Uncomfortable with contractions, not dimished by hydration Patient reports the fetal movement as active. Patient reports uterine contraction  activity as regular, every 6-8 minutes. Patient reports  vaginal bleeding as none. Patient describes fluid per vagina as Clear.  Vitals:  Blood pressure 82/41, pulse 98, temperature 97.1 F (36.2 C), temperature source Axillary, resp. rate 16, height 4\' 9"  (1.448 m), weight 89.086 kg (196 lb 6.4 oz), last menstrual period 12/12/2012. Physical Examination:  General appearance - alert, well appearing, and in no distress Heart - normal rate and regular rhythm Abdomen - soft, nontender, nondistended Fundal Height:  size equals dates Cervical Exam: Evaluated by digital exam. and Position: mid position and found to be 6/ 100%/-1 and fetal presentation is breech. Extremities: extremities normal, atraumatic, no cyanosis or edema and Homans sign is negative, no sign of DVT with DTRs 2+ bilaterally Membranes:intact, ruptured  Fetal Monitoring:  Baseline: 160 bpm, Variability: Fair (1-6 bpm), Accelerations: Non-reactive but appropriate for gestational age and Decelerations: Absent  Labs:  Results for orders placed during the hospital encounter of 07/09/13 (from the past 24 hour(s))  TYPE AND SCREEN   Collection Time    07/12/13  6:49 AM      Result Value Ref Range   ABO/RH(D) B POS     Antibody Screen NEG     Sample Expiration 07/15/2013      Imaging Studies:    Currently EPIC will not allow sonographic studies to automatically populate into notes.  In the meantime, copy and paste results into note or free text.  Medications:  Scheduled . amoxicillin  500 mg Oral Q8H  .  docusate sodium  100 mg Oral Daily  . erythromycin  250 mg Oral Q6H  . lidocaine 1%/Na bicarb 0.1 mEq      . lidocaine 1%/Na bicarb 0.1 mEq      . lidocaine 1%/Na bicarb 0.1 mEq      . prenatal multivitamin  1 tablet Oral Q1200   I have reviewed the patient's current medications.  ASSESSMENT: Patient Active Problem List   Diagnosis Date Noted  . Premature rupture of membranes in third trimester 07/09/2013  active labor Bianca FellersFrank breech  PLAN: Cesarean section : consents obtained, will proceed to OR.  Bianca Ortiz V 07/13/2013,12:33 AM

## 2013-07-13 NOTE — Transfer of Care (Signed)
Immediate Anesthesia Transfer of Care Note  Patient: Farrel GordonCynthia Kluth  Procedure(s) Performed: Procedure(s): CESAREAN SECTION (N/A)  Patient Location: PACU  Anesthesia Type:Spinal  Level of Consciousness: awake, alert , oriented and patient cooperative  Airway & Oxygen Therapy: Patient Spontanous Breathing  Post-op Assessment: Report given to PACU RN and Post -op Vital signs reviewed and stable  Post vital signs: Reviewed and stable  Complications: No apparent anesthesia complications

## 2013-07-13 NOTE — Brief Op Note (Signed)
07/09/2013 - 07/13/2013  2:02 AM  PATIENT:  Bianca Ortiz  28 y.o. female  PRE-OPERATIVE DIAGNOSIS:  Breech, Premature Rupture of membranes, pregnancy 9152w3d  POST-OPERATIVE DIAGNOSIS:  Breech, Premature Rupture of membranes, 30w 3 d  PROCEDURE:  Procedure(s): CESAREAN SECTION (N/A)  SURGEON:  Surgeon(s) and Role:    * Tilda BurrowJohn V Peyson Delao, MD - Primary  PHYSICIAN ASSISTANT:   ASSISTANTS: none   ANESTHESIA:   spinal  EBL:  Total I/O In: 1000 [I.V.:1000] Out: 800 [Urine:100; Blood:700]  BLOOD ADMINISTERED:none  DRAINS: Urinary Catheter (Foley)   LOCAL MEDICATIONS USED:  NONE  SPECIMEN:  Source of Specimen:  Sent to pathology, placenta  DISPOSITION OF SPECIMEN:  PATHOLOGY  COUNTS:  YES  TOURNIQUET:  * No tourniquets in log *  DICTATION: .Dragon Dictation  PLAN OF CARE: As inpatient admission already  PATIENT DISPOSITION:  PACU - hemodynamically stable.   Delay start of Pharmacological VTE agent (>24hrs) due to surgical blood loss or risk of bleeding:

## 2013-07-13 NOTE — Anesthesia Postprocedure Evaluation (Signed)
  Anesthesia Post-op Note  Patient: Bianca GordonCynthia Plack  Procedure(s) Performed: Procedure(s): CESAREAN SECTION (N/A)  Patient Location: PACU and Women's Unit  Anesthesia Type:General  Level of Consciousness: awake, alert , oriented and patient cooperative  Airway and Oxygen Therapy: Patient Spontanous Breathing  Post-op Pain: none  Post-op Assessment: Post-op Vital signs reviewed, Patient's Cardiovascular Status Stable, Respiratory Function Stable, No signs of Nausea or vomiting, Adequate PO intake and Pain level controlled  Post-op Vital Signs: Reviewed and stable  Complications: No apparent anesthesia complications

## 2013-07-13 NOTE — Anesthesia Preprocedure Evaluation (Signed)
Anesthesia Evaluation  Patient identified by MRN, date of birth, ID band Patient awake    Reviewed: Allergy & Precautions, H&P , NPO status , Patient's Chart, lab work & pertinent test results  Airway Mallampati: III      Dental   Pulmonary  breath sounds clear to auscultation        Cardiovascular Exercise Tolerance: Good Rhythm:regular Rate:Normal     Neuro/Psych    GI/Hepatic   Endo/Other  Morbid obesity  Renal/GU      Musculoskeletal   Abdominal   Peds  Hematology   Anesthesia Other Findings   Reproductive/Obstetrics (+) Pregnancy                           Anesthesia Physical Anesthesia Plan  ASA: III and emergent  Anesthesia Plan: Spinal   Post-op Pain Management:    Induction:   Airway Management Planned:   Additional Equipment:   Intra-op Plan:   Post-operative Plan:   Informed Consent: I have reviewed the patients History and Physical, chart, labs and discussed the procedure including the risks, benefits and alternatives for the proposed anesthesia with the patient or authorized representative who has indicated his/her understanding and acceptance.     Plan Discussed with: Anesthesiologist, CRNA and Surgeon  Anesthesia Plan Comments:         Anesthesia Quick Evaluation

## 2013-07-13 NOTE — Op Note (Signed)
PRE-OPERATIVE DIAGNOSIS:  Breech, Premature Rupture of membranes at 30 weeks 3 days, labor  POST-OPERATIVE DIAGNOSIS:  Breech, Premature Rupture of membranes at 30 weeks 3 days,  labor  PROCEDURE:  Procedure(s): Primary low transverse cervical cesarean section CESAREAN SECTION (N/A)  SURGEON:  Surgeon(s) and Role:    * Tilda BurrowJohn V Lavonte Palos, MD - Primary  PHYSICIAN ASSISTANT:   ASSISTANTS: none   ANESTHESIA:   spinal  EBL:  Total I/O In: 2200 [I.V.:2200] Out: 800 [Urine:100; Blood:700] Details of procedure: Patient was taken to the operating room, spinal anesthesia introduced, Foley catheter inserted and the abdomen prepped and draped after timeout and 3 minute wait. Transverse lower abdominal incision was performed, and peritoneum opened in the midline, Alexis wound retractor positioned, bladder flap developed and transverse lower uterine segment incision performed, with a sharp midline neck down almost to the baby blunt dissection entering the uterine cavity, transverse extension of the incision and deliver the breech infant with these with controlled delivery of the head. Toward was clamped and the infant taken to the pediatricians Apgars 9 and 9 were assigned weight was 3 pounds ____ounces, Placenta was delivered by Cred uterine massage. There was extensive area of thickened white membrane the required extraction and small fragments. The uterus was swabbed out several times, until clean and then irrigated generously. Uterus was then closed in a running locking first layer with 0 Monocryl and then a continuous running second layer. Second layer seemed to catch the dome of the bladder for 2 stitches, which were taken out released and care made to ensure that the bladder was free during the closure . Abdomen was irrigated out, the bladder flap was loosely reapproximated times one single stitch in the midline. Abdomen was closed using running 2-0 Vicryl on the anterior peritoneal cavity after  irrigation, the fascia then closed using running 0 Vicryl, subcutaneous tissues irrigated, then closed with 3 interrupted sutures of 2-0 Vicryl loosely approximated, then subcuticular 4-0 Vicryl closure the skin. Sponge and needle counts were correct throughout. Steri-Strip and benzoin were used to to reinforce the closure.

## 2013-07-13 NOTE — Addendum Note (Signed)
Addendum created 07/13/13 0751 by Orlie Pollenebra R Porshea Janowski, CRNA   Modules edited: Notes Section   Notes Section:  File: [id number]

## 2013-07-13 NOTE — Anesthesia Postprocedure Evaluation (Signed)
Anesthesia Post Note  Patient: Bianca GordonCynthia Ortiz  Procedure(s) Performed: Procedure(s) (LRB): CESAREAN SECTION (N/A)  Anesthesia type: Spinal  Patient location: PACU  Post pain: Pain level controlled  Post assessment: Post-op Vital signs reviewed  Last Vitals:  Filed Vitals:   07/13/13 0537  BP: 98/64  Pulse: 74  Temp: 36.7 C  Resp: 16    Post vital signs: Reviewed  Level of consciousness: awake  Complications: No apparent anesthesia complications

## 2013-07-13 NOTE — Anesthesia Procedure Notes (Signed)
Spinal  Patient location during procedure: OR Start time: 07/13/2013 1:03 AM Staffing Anesthesiologist: Brayton CavesJACKSON, Tywanda Rice Performed by: anesthesiologist  Preanesthetic Checklist Completed: patient identified, site marked, surgical consent, pre-op evaluation, timeout performed, IV checked, risks and benefits discussed and monitors and equipment checked Spinal Block Patient position: sitting Prep: DuraPrep Patient monitoring: heart rate, cardiac monitor, continuous pulse ox and blood pressure Approach: midline Location: L3-4 Injection technique: single-shot Needle Needle type: Sprotte  Needle gauge: 24 G Needle length: 9 cm Assessment Sensory level: T4 Additional Notes Patient identified.  Risk benefits discussed including failed block, incomplete pain control, headache, nerve damage, paralysis, blood pressure changes, nausea, vomiting, reactions to medication both toxic or allergic, and postpartum back pain.  Patient expressed understanding and wished to proceed.  All questions were answered.  Sterile technique used throughout procedure.  CSF was clear.  No parasthesia or other complications.  Please see nursing notes for vital signs.

## 2013-07-14 ENCOUNTER — Encounter (HOSPITAL_COMMUNITY): Payer: Self-pay | Admitting: Obstetrics and Gynecology

## 2013-07-14 NOTE — Progress Notes (Signed)
Subjective: Postpartum Day 1: Cesarean Delivery Patient reports tolerating PO, + flatus, + BM and no problems voiding.    Objective: Vital signs in last 24 hours: Temp:  [97.9 F (36.6 C)-98.3 F (36.8 C)] 97.9 F (36.6 C) (02/27 0607) Pulse Rate:  [65-81] 76 (02/27 0607) Resp:  [15-18] 18 (02/27 0607) BP: (99-118)/(38-72) 110/72 mmHg (02/27 0607) SpO2:  [97 %-98 %] 97 % (02/27 [id number])  Physical Exam:  General: alert, cooperative and no distress Lochia: appropriate Uterine Fundus: firm Incision: healing well, no significant drainage, no significant erythema. Some dried blood on the honeycomb but not expanding.  DVT Evaluation: No evidence of DVT seen on physical exam.   Recent Labs  07/13/13 0040 07/13/13 0853  HGB 11.1* 9.8*  HCT 32.5* 29.2*    Assessment/Plan: Status post Cesarean section. Doing well postoperatively.  Feeding: Hoping to breast feed; pumping now and supplementing with formula Contraception: Wants BTL; Will need to be scheduled as outpatient  Continue current care.  Wenda LowJoyner, James 07/14/2013, 8:39 AM  I have seen and examined this patient and agree with above documentation in the resident's note.   Rulon AbideKeli Idonia Zollinger, M.D. [facility] 07/14/2013 9:20 AM

## 2013-07-14 NOTE — Lactation Note (Signed)
This note was copied from the chart of Bianca Ortiz. Lactation Consultation Note  Patient Name: Bianca Farrel GordonCynthia Finfrock ZOXWR'UToday's Date: 07/14/2013 Reason for consult: Follow-up assessment;NICU baby Mom reports that she is pumping receiving small amount of colostrum. Mom denies nipple tenderness, no questions or concerns. Mom has hand pump for home use. Gave Mom the number for BF hotline with [employer] to call to arrange for pump for home use. [facility], she will advise. [facility]   Maternal Data    Feeding    LATCH Score/Interventions                      Lactation Tools Discussed/Used Tools: Pump Breast pump type: Double-Electric Breast Pump   Consult Status Consult Status: Complete    Alfred LevinsGranger, Corinne Goucher Ann 07/14/2013, 11:06 AM

## 2013-07-15 DIAGNOSIS — O321XX0 Maternal care for breech presentation, not applicable or unspecified: Secondary | ICD-10-CM

## 2013-07-15 DIAGNOSIS — O411090 Infection of amniotic sac and membranes, unspecified, unspecified trimester, not applicable or unspecified: Secondary | ICD-10-CM

## 2013-07-15 DIAGNOSIS — O4290 Premature rupture of membranes, unspecified as to length of time between rupture and onset of labor, unspecified weeks of gestation: Secondary | ICD-10-CM

## 2013-07-15 NOTE — Progress Notes (Signed)
Subjective: Postpartum Day 2: Cesarean Delivery Patient reports tolerating PO, + flatus, + BM and no problems voiding.    Objective: Vital signs in last 24 hours: Temp:  [97.5 F (36.4 C)-98 F (36.7 C)] 97.8 F (36.6 C) (02/28 0544) Pulse Rate:  [72-93] 72 (02/28 0544) Resp:  [18] 18 (02/28 0544) BP: (108-123)/(57-72) 123/72 mmHg (02/28 0544) SpO2:  [98 %-100 %] 99 % (02/28 0544)  Physical Exam:  General: alert, cooperative and no distress Lochia: appropriate Uterine Fundus: firm Incision: healing well, no significant drainage, no dehiscence, no significant erythema DVT Evaluation: No evidence of DVT seen on physical exam. No cords or calf tenderness. No significant calf/ankle edema.   Recent Labs  07/13/13 0040 07/13/13 0853  HGB 11.1* 9.8*  HCT 32.5* 29.2*    Assessment/Plan: Status post Cesarean section. Doing well postoperatively.  Feeding: Hoping to breast feed; pumping now and supplementing with formula while baby in NICU Contraception: Wants BTL; Will need to be scheduled as outpatient Continue current care.  Bianca Ortiz, Bianca Ortiz 07/15/2013, 7:44 AM

## 2013-07-15 NOTE — Discharge Instructions (Signed)
Postpartum Care After Cesarean Delivery °After you deliver your newborn (postpartum period), the usual stay in the hospital is 24 72 hours. If there were problems with your labor or delivery, or if you have other medical problems, you might be in the hospital longer.  °While you are in the hospital, you will receive help and instructions on how to care for yourself and your newborn during the postpartum period.  °While you are in the hospital: °· It is normal for you to have pain or discomfort from the incision in your abdomen. Be sure to tell your nurses when you are having pain, where the pain is located, and what makes the pain worse. °· If you are breastfeeding, you may feel uncomfortable contractions of your uterus for a couple of weeks. This is normal. The contractions help your uterus get back to normal size. °· It is normal to have some bleeding after delivery. °· For the first 1 3 days after delivery, the flow is red and the amount may be similar to a period. °· It is common for the flow to start and stop. °· In the first few days, you may pass some small clots. Let your nurses know if you begin to pass large clots or your flow increases. °· Do not  flush blood clots down the toilet before having the nurse look at them. °· During the next 3 10 days after delivery, your flow should become more watery and pink or brown-tinged in color. °· Ten to fourteen days after delivery, your flow should be a small amount of yellowish-white discharge. °· The amount of your flow will decrease over the first few weeks after delivery. Your flow may stop in 6 8 weeks. Most women have had their flow stop by 12 weeks after delivery. °· You should change your sanitary pads frequently. °· Wash your hands thoroughly with soap and water for at least 20 seconds after changing pads, using the toilet, or before holding or feeding your newborn. °· Your intravenous (IV) tubing will be removed when you are drinking enough fluids. °· The  urine drainage tube (urinary catheter) that was inserted before delivery may be removed within 6 8 hours after delivery or when feeling returns to your legs. You should feel like you need to empty your bladder within the first 6 8 hours after the catheter has been removed. °· In case you become weak, lightheaded, or faint, call your nurse before you get out of bed for the first time and before you take a shower for the first time. °· Within the first few days after delivery, your breasts may begin to feel tender and full. This is called engorgement. Breast tenderness usually goes away within 48 72 hours after engorgement occurs. You may also notice milk leaking from your breasts. If you are not breastfeeding, do not stimulate your breasts. Breast stimulation can make your breasts produce more milk. °· Spending as much time as possible with your newborn is very important. During this time, you and your newborn can feel close and get to know each other. Having your newborn stay in your room (rooming in) will help to strengthen the bond with your newborn. It will give you time to get to know your newborn and become comfortable caring for your newborn. °· Your hormones change after delivery. Sometimes the hormone changes can temporarily cause you to feel sad or tearful. These feelings should not last more than a few days. If these feelings last longer   than that, you should talk to your caregiver. °· If desired, talk to your caregiver about methods of family planning or contraception. °· Talk to your caregiver about immunizations. Your caregiver may want you to have the following immunizations before leaving the hospital: °· Tetanus, diphtheria, and pertussis (Tdap) or tetanus and diphtheria (Td) immunization. It is very important that you and your family (including grandparents) or others caring for your newborn are up-to-date with the Tdap or Td immunizations. The Tdap or Td immunization can help protect your newborn  from getting ill. °· Rubella immunization. °· Varicella (chickenpox) immunization. °· Influenza immunization. You should receive this annual immunization if you did not receive the immunization during your pregnancy. °Document Released: 01/27/2012 Document Reviewed: 01/27/2012 °ExitCare® Patient Information ©2014 [employer]. ° °

## 2013-07-15 NOTE — Progress Notes (Signed)
I have seen and examined this patient and I agree with the above. Anticipate d/c in AM 07/16/13. Cam HaiSHAW, KIMBERLY 8:51 AM 07/15/2013

## 2013-07-16 MED ORDER — IBUPROFEN 600 MG PO TABS: 600.0000 mg | ORAL_TABLET | Freq: Four times a day (QID) | ORAL | Status: DC | PRN

## 2013-07-16 MED ORDER — OXYCODONE-ACETAMINOPHEN 5-325 MG PO TABS: 1.0000 | ORAL_TABLET | Freq: Four times a day (QID) | ORAL | Status: DC | PRN

## 2013-07-16 MED ORDER — DOCUSATE SODIUM 100 MG PO CAPS: 100.0000 mg | ORAL_CAPSULE | Freq: Two times a day (BID) | ORAL | Status: DC | PRN

## 2013-07-16 MED ORDER — NORETHINDRONE 0.35 MG PO TABS: 1.0000 | ORAL_TABLET | Freq: Every day | ORAL | Status: DC

## 2013-07-16 NOTE — Discharge Summary (Signed)
Obstetric Discharge Summary Reason for Admission: PPROM at 3510w6d Prenatal Procedures: NST and ultrasound Intrapartum Procedures: PLTCS Postpartum Procedures: none Complications-Operative and Postpartum: none  Discharge Hemoglobin/Hemoglobin:  Lab Results  Component Value Date/Time   HGB 9.8* 07/13/2013  8:53 AM   HCT 29.2* 07/13/2013  8:53 AM    Discharge Diagnoses: PPROM - preterm delivery  Brief Hospital Course: 28 y.o. is a [id number] who was admitted 07/09/2013 at 2410w6d with preterm premature rupture of membranes (PPROM).  She was admitted to antenatal unit and started on betamethasone (2 doses, 24 hours apart), latency antibiotics, and magnesium sulfate for tocolysis and fetal neuroprotection.  She had no signs or symptoms of toxicity, and the magnesium sulfate was discontinued after 24 hours. Patient also had neonatology consultation.  She had no signs or symptoms of progressing preterm labor,  daily NSTs which remained reassuring, and initially had no other maternal-fetal concerns.  However, on 07/13/13, she started to have preterm contractions and was noted to have progression of preterm labor into active labor.  Fetal presentation was noted to be breech,  She was taken to the OR for cesarean section, please see operative report for more details. Infant was taken to the NICU, and the patient was transferred to the postpartum unit.    Patient had an uncomplicated postpartum course.  By time of discharge on POD#3/PPD#3, her pain was controlled on oral pain medications; she had appropriate lochia and was ambulating, voiding without difficulty, tolerating regular diet and passing flatus.  Her incision was C/D/I, honeycomb dressing in place. She is breastfeeding and will use Micronor for postpartum contraception.  She was deemed stable for discharge to home.    Discharge Exam: Blood pressure 118/72, pulse 73, temperature 98 F (36.7 C), temperature source Oral, resp. rate 15, height 4\' 9"  (1.448  m), weight 196 lb 6.4 oz (89.086 kg), last menstrual period 12/12/2012, SpO2 100.00%, unknown if currently breastfeeding. General appearance: Alert and no distress Resp: Clear to auscultation bilaterally Cardio: Regular rate and rhythm Abdomen: Fundus below umbilicus, appropriate lochia. NT abdomen. Incision: C/D/I, honeycomb dressing in place. Extremities: Extremities normal, atraumatic, no cyanosis or edema and Homans sign is negative, no sign of DVT Pulses: 2+ and symmetric Neurologic: Alert and oriented X 3, normal strength and tone. Normal symmetric reflexes. Normal coordination and gait   Discharge Information: Date: 07/16/2013 Activity: pelvic rest x 6 weeks Diet: routine Baby feeding: plans to breastfeed Contraception: oral progesterone-only contraceptive Medications: Ibuprofen, Colace, Percocet and Micronor Condition: stable Instructions: Routine post-cesarean section instructions Discharge to: home  Bianca Ortiz  Bianca Fulgham, MD, FACOG Attending Obstetrician & Gynecologist Faculty Practice, [facility]

## 2013-07-16 NOTE — Progress Notes (Signed)
Clinical Social Work Department PSYCHOSOCIAL ASSESSMENT - MATERNAL/CHILD 07/16/2013  Patient:  Bianca Ortiz,Bianca Ortiz  Account Number:  [id number]  [place] Date:  07/09/2013  Ardine Eng Name:   Vevelyn Francois    Clinical Social Worker:  Charvis Lightner, LCSW   Date/Time:  07/16/2013 11:00 AM  Date Referred:  07/16/2013   Referral source  NICU     Referred reason  NICU   Other referral source:    I:  FAMILY / [place] legal guardian:  PARENT  Guardian - Name Guardian - Age Guardian - Address  [place] [address]  [place], [place] [address]  Crista Luria  same as above   Other household support members/support persons Other support:    II  PSYCHOSOCIAL DATA Information Source:    Occupational hygienist Employment:   Museum/gallery curator resources:  [employer] [place]:   Other  [place] / Grade:   Maternity Care Coordinator / Child Services Coordination / Early Interventions:  Cultural issues impacting care:    III  STRENGTHS Strengths  Understanding of illness  Supportive family/friends  Adequate Resources   Strength comment:    IV  RISK FACTORS AND CURRENT PROBLEMS Current Problem:       V  SOCIAL WORK ASSESSMENT Met with both parents.  They were pleasant and receptive to social work intervention.  Parents are married.  They have 3 other dependents ages 64,10, and 49.   Both parents are employed and mother is unsure if she will be returning to work.  Both parents seems to be coping well with newborn NICU admission.  Informed that they have spoken with the medical team and was told that newborn is stable and doing well.  Parents communicate hopes that infant will continue to do well. Mother denies any hx of substance abuse or mental illness.   No acute social concerns related at this time.   Informed that transportation to visit newborn will not be an issue.  Parents informed of CSW availability.      VI SOCIAL WORK PLAN Social Work Plan   Psychosocial Support/Ongoing Assessment of Needs   Type of pt/family education:   If child protective services report - county:   If child protective services report - date:   Information/referral to community resources comment:   Parents plan to use [employer] for well baby care   Other social work plan:

## 2013-07-16 NOTE — Progress Notes (Signed)
Discharge instructions provided to patient and significant other at bedside.  Activity, medications, incision care, follow up appointments, when to call the doctor and community resources discussed.  No questions at this time.  Patient left unit in stable condition with all personal belongings, prescriptions and rented breast pump.  Osvaldo AngstK. Brenn Gatton, RN----------

## 2013-07-26 ENCOUNTER — Encounter: Payer: Self-pay | Admitting: *Deleted

## 2013-08-01 ENCOUNTER — Ambulatory Visit: Payer: Self-pay

## 2013-08-01 NOTE — Lactation Note (Signed)
This note was copied from the chart of Bianca Farrel GordonCynthia Mancino. Lactation Consultation Note    Follow up brief consult with this mom of a NICU baby, now 662 weeks old, and 33 weeks corrected gestation. Mom denies any problems or discomfort with pumping. She is expressing about 90 mls every 3 hours. This is mom's first premie, and fourth baby. I suggested she pump every 4 hours at night now, unless her breasts wake her up, and then every 2-3 during the day, her goal still being 8 times a day. This gives her a little more sleep at night. Mom is aware I will be available to her for questions/concerns.   Patient Name: Bianca Farrel GordonCynthia Douthitt WGNFA'OToday's Date: 08/01/2013     Maternal Data    Feeding Feeding Type: Breast Milk Length of feed: 60 min  LATCH Score/Interventions                      Lactation Tools Discussed/Used     Consult Status      Alfred LevinsLee, Macguire Holsinger Anne 08/01/2013, 2:51 PM

## 2013-08-07 ENCOUNTER — Ambulatory Visit (INDEPENDENT_AMBULATORY_CARE_PROVIDER_SITE_OTHER): Payer: Medicaid Other | Admitting: Obstetrics & Gynecology

## 2013-08-07 ENCOUNTER — Encounter: Payer: Self-pay | Admitting: Obstetrics & Gynecology

## 2013-08-07 DIAGNOSIS — Z3009 Encounter for other general counseling and advice on contraception: Secondary | ICD-10-CM

## 2013-08-07 NOTE — Progress Notes (Signed)
Pt delivered on 2/26 via csection. She is here today to get scheduled for BTS. She is breastfeeding.

## 2013-08-07 NOTE — Progress Notes (Signed)
  Subjective:     Bianca GordonCynthia Ortiz is a 28 y.o. female who presents for a postpartum visit. She is 3 week postpartum following a low cervical transverse Cesarean section. I have fully reviewed the prenatal and intrapartum course. The delivery was at 30 gestational weeks. Outcome: primary cesarean section, low transverse incision. Anesthesia: spinal. Postpartum course has been normal. Baby's course has been good, still in NICU. Baby is feeding by breast. Bleeding no bleeding. Bowel function is normal. Bladder function is normal. Patient is not sexually active. Contraception method is abstinence. Postpartum depression screening: negative.  The following portions of the patient's history were reviewed and updated as appropriate: allergies, current medications, past family history, past medical history, past social history, past surgical history and problem list.  Review of Systems Pertinent items are noted in HPI.   Objective:    BP 119/78  Pulse 82  Ht 4\' 9"  (1.448 m)  Wt 192 lb 6.4 oz (87.272 kg)  BMI 41.62 kg/m2  Breastfeeding? Yes  General:  alert, cooperative and no distress   Breasts:     Lungs: normal effort  Heart:     Abdomen: soft, non-tender; bowel sounds normal; no masses,  no organomegaly   Vulva:  not evaluated  Vagina: not evaluated  Cervix:     Corpus: not examined  Adnexa:  not evaluated  Rectal Exam: Not performed.        Assessment:     normal postpartum exam. Pap smear not done at today's visit.   Plan:    1. Contraception: tubal ligation. The procedure and the risk of anesthesia, bleeding, infection, bowel and bladder injury, failure (1/200) and ectopic pregnancy were discussed and her questions were answered. The procedure will be scheduled as an outpatient.   2. abstinence 3. Follow up postop  Adam PhenixJames G Isauro Skelley, MD 08/07/2013

## 2013-08-07 NOTE — Patient Instructions (Signed)
Laparoscopic Tubal Ligation  Laparoscopic tubal ligation is a procedure that closes the fallopian tubes at a time other than right after childbirth. By closing the fallopian tubes, the eggs that are released from the ovaries cannot enter the uterus and sperm cannot reach the egg. Tubal ligation is also known as getting your "tubes tied." Tubal ligation is done so you will not be able to get pregnant or have a baby.   Although this procedure may be reversed, it should be considered permanent and irreversible. If you want to have future pregnancies, you should not have this procedure.   LET YOUR CAREGIVER KNOW ABOUT:  · Allergies to food or medicine.  · Medicines taken, including vitamins, herbs, eyedrops, over-the-counter medicines, and creams.  · Use of steroids (by mouth or creams).  · Previous problems with numbing medicines.  · History of bleeding problems or blood clots.  · Any recent colds or infections.  · Previous surgery.  · Other health problems, including diabetes and kidney problems.  · Possibility of pregnancy, if this applies.  · Any past pregnancies.  RISKS AND COMPLICATIONS   · Infection.  · Bleeding.  · Injury to surrounding organs.  · Anesthetic side effects.  · Failure of the procedure.  · Ectopic pregnancy.  · Future regret about having the procedure done.  BEFORE THE PROCEDURE  · Do not take aspirin or blood thinners a week before the procedure or as directed. This can cause bleeding.  · Do not eat or drink anything 6 to 8 hours before the procedure.  PROCEDURE   · You may be given a medicine to help you relax (sedative) before the procedure. You will be given a medicine to make you sleep (general anesthetic) during the procedure.  · A tube will be put down your throat to help your breath while under general anesthesia.  · Two small cuts (incisions) are made in the lower abdominal area and near the belly button.  · Your abdominal area will be inflated with a safe gas (carbon dioxide). This helps  give the surgeon room to operate, visualize, and helps the surgeon avoid other organs.  · A thin, lighted tube (laparoscope) with a camera attached is inserted into your abdomen through one of the incisions near the belly button. Other small instruments are also inserted through the other abdominal incision.  · The fallopian tubes are located and are either blocked with a ring, clip, or are burned (cauterized).  · After the fallopian tubes are blocked, the gas is released from the abdomen.  · The incisions will be closed with stitches (sutures), and a bandage may be placed over the incisions.  AFTER THE PROCEDURE   · You will rest in a recovery room for 1 4 hours until you are stable and doing well.  · You will also have some mild abdominal discomfort for 3 7 days. You will be given pain medicine to ease any discomfort.  · As long as there are no problems, you may be allowed to go home. Someone will need to drive you home and be with you for at least 24 hours once home.  · You may have some mild discomfort in the throat. This is from the tube placed in your throat while you were sleeping.  · You may experience discomfort in the shoulder area from some trapped air between the liver and diaphragm. This sensation is normal and will slowly go away on its own.  Document Released: 08/10/2000 Document   Revised: 11/03/2011 Document Reviewed: 08/15/2011  ExitCare® Patient Information ©2014 [employer].

## 2013-08-09 ENCOUNTER — Encounter: Payer: Self-pay | Admitting: *Deleted

## 2013-08-10 ENCOUNTER — Ambulatory Visit: Payer: Self-pay

## 2013-08-10 NOTE — Lactation Note (Signed)
This note was copied from the chart of Bianca Ortiz. Lactation Consultation Note     Follow up consutlt with this mom of a NICU baby, being discharged today, now 70 weeks old, and 234 3/7 weeks corrected gestation.  Mom needed extra membrane for her pumping kit, which I gave her. I reviewed late pre term babies and how it effect breast feeding (small size and decrease strength of suck), triple feeding, and encouraged mom to come back for o/p consults to help transtittion her baby to full beast feeidng  Patient Name: Bianca Ortiz [id number] Date: 08/10/2013 Reason for consult: Follow-up assessment;NICU baby;Infant < 6lbs;Late preterm infant   Maternal Data    Feeding Feeding Type: Breast Milk with Formula added Nipple Type: Slow - flow Length of feed: 30 min  LATCH Score/Interventions                      Lactation Tools Discussed/Used     Consult Status Consult Status: Complete Follow-up type: Call as needed    Tonna Corner 08/10/2013, 3:41 PM

## 2013-08-16 ENCOUNTER — Ambulatory Visit (HOSPITAL_COMMUNITY): Payer: Medicaid Other

## 2013-09-12 ENCOUNTER — Ambulatory Visit (HOSPITAL_COMMUNITY): Payer: Medicaid Other | Admitting: Anesthesiology

## 2013-09-12 ENCOUNTER — Encounter (HOSPITAL_COMMUNITY): Payer: Medicaid Other | Admitting: Anesthesiology

## 2013-09-12 ENCOUNTER — Encounter (HOSPITAL_COMMUNITY): Payer: Self-pay | Admitting: *Deleted

## 2013-09-12 ENCOUNTER — Encounter (HOSPITAL_COMMUNITY)
Admission: RE | Disposition: A | Discharge status: Home / Self Care | Payer: Medicaid Other | Source: Ambulatory Visit | Attending: Obstetrics & Gynecology

## 2013-09-12 ENCOUNTER — Ambulatory Visit (HOSPITAL_COMMUNITY)
Admission: RE | Admit: 2013-09-12 | Discharge: 2013-09-12 | Disposition: A | Discharge status: Home / Self Care | Payer: Medicaid Other | Source: Ambulatory Visit | Attending: Obstetrics & Gynecology | Admitting: Obstetrics & Gynecology

## 2013-09-12 DIAGNOSIS — Z6841 Body Mass Index (BMI) 40.0 and over, adult: Secondary | ICD-10-CM | POA: Insufficient documentation

## 2013-09-12 DIAGNOSIS — Z302 Encounter for sterilization: Secondary | ICD-10-CM

## 2013-09-12 HISTORY — PX: LAPAROSCOPIC TUBAL LIGATION: SHX1937

## 2013-09-12 LAB — CBC
HCT: 36 % (ref 36.0–46.0)
Hemoglobin: 12.3 g/dL (ref 12.0–15.0)
MCH: 28 pg (ref 26.0–34.0)
MCHC: 34.2 g/dL (ref 30.0–36.0)
MCV: 81.8 fL (ref 78.0–100.0)
PLATELETS: 363 10*3/uL (ref 150–400)
RBC: 4.4 MIL/uL (ref 3.87–5.11)
RDW: 13.5 % (ref 11.5–15.5)
WBC: 8.2 10*3/uL (ref 4.0–10.5)

## 2013-09-12 LAB — PREGNANCY, URINE: PREG TEST UR: NEGATIVE

## 2013-09-12 SURGERY — LIGATION, FALLOPIAN TUBE, LAPAROSCOPIC
Anesthesia: General | Site: Abdomen | Laterality: Bilateral

## 2013-09-12 MED ORDER — MEPERIDINE HCL 25 MG/ML IJ SOLN: 6.2500 mg | INTRAMUSCULAR | Status: DC | PRN

## 2013-09-12 MED ORDER — BUPIVACAINE HCL (PF) 0.25 % IJ SOLN
INTRAMUSCULAR | Status: AC
  Filled 2013-09-12: qty 30

## 2013-09-12 MED ORDER — LACTATED RINGERS IV SOLN: INTRAVENOUS | Status: DC

## 2013-09-12 MED ORDER — GLYCOPYRROLATE 0.2 MG/ML IJ SOLN
INTRAMUSCULAR | Status: AC
  Filled 2013-09-12: qty 2

## 2013-09-12 MED ORDER — GLYCOPYRROLATE 0.2 MG/ML IJ SOLN
INTRAMUSCULAR | Status: DC | PRN
  Administered 2013-09-12: 0.4 mg via INTRAVENOUS

## 2013-09-12 MED ORDER — KETOROLAC TROMETHAMINE 30 MG/ML IJ SOLN
INTRAMUSCULAR | Status: DC | PRN
  Administered 2013-09-12: 30 mg via INTRAVENOUS

## 2013-09-12 MED ORDER — FENTANYL CITRATE 0.05 MG/ML IJ SOLN
25.0000 ug | INTRAMUSCULAR | Status: DC | PRN
  Administered 2013-09-12 (×3): 50 ug via INTRAVENOUS

## 2013-09-12 MED ORDER — PROPOFOL 10 MG/ML IV BOLUS
INTRAVENOUS | Status: DC | PRN
  Administered 2013-09-12: 180 mg via INTRAVENOUS

## 2013-09-12 MED ORDER — MIDAZOLAM HCL 2 MG/2ML IJ SOLN
INTRAMUSCULAR | Status: DC | PRN
  Administered 2013-09-12: 2 mg via INTRAVENOUS

## 2013-09-12 MED ORDER — NEOSTIGMINE METHYLSULFATE 1 MG/ML IJ SOLN
INTRAMUSCULAR | Status: AC
  Filled 2013-09-12: qty 1

## 2013-09-12 MED ORDER — NEOSTIGMINE METHYLSULFATE 1 MG/ML IJ SOLN
INTRAMUSCULAR | Status: DC | PRN
  Administered 2013-09-12: 3 mg via INTRAVENOUS

## 2013-09-12 MED ORDER — ROCURONIUM BROMIDE 100 MG/10ML IV SOLN
INTRAVENOUS | Status: AC
  Filled 2013-09-12: qty 1

## 2013-09-12 MED ORDER — FENTANYL CITRATE 0.05 MG/ML IJ SOLN
INTRAMUSCULAR | Status: DC | PRN
  Administered 2013-09-12: 100 ug via INTRAVENOUS

## 2013-09-12 MED ORDER — OXYCODONE-ACETAMINOPHEN 5-325 MG PO TABS: 1.0000 | ORAL_TABLET | Freq: Four times a day (QID) | ORAL | Status: DC | PRN

## 2013-09-12 MED ORDER — OXYCODONE-ACETAMINOPHEN 5-325 MG PO TABS
ORAL_TABLET | ORAL | Status: AC
  Filled 2013-09-12: qty 1

## 2013-09-12 MED ORDER — ROCURONIUM BROMIDE 100 MG/10ML IV SOLN
INTRAVENOUS | Status: DC | PRN
  Administered 2013-09-12: 30 mg via INTRAVENOUS

## 2013-09-12 MED ORDER — ONDANSETRON HCL 4 MG/2ML IJ SOLN
INTRAMUSCULAR | Status: DC | PRN
  Administered 2013-09-12: 4 mg via INTRAVENOUS

## 2013-09-12 MED ORDER — FENTANYL CITRATE 0.05 MG/ML IJ SOLN
INTRAMUSCULAR | Status: AC
  Filled 2013-09-12: qty 2

## 2013-09-12 MED ORDER — FENTANYL CITRATE 0.05 MG/ML IJ SOLN
INTRAMUSCULAR | Status: AC
Start: 2013-09-12 — End: 2013-09-12
  Filled 2013-09-12: qty 5

## 2013-09-12 MED ORDER — OXYCODONE-ACETAMINOPHEN 5-325 MG PO TABS
1.0000 | ORAL_TABLET | ORAL | Status: DC | PRN
  Administered 2013-09-12: 1 via ORAL

## 2013-09-12 MED ORDER — PROPOFOL 10 MG/ML IV EMUL
INTRAVENOUS | Status: AC
  Filled 2013-09-12: qty 20

## 2013-09-12 MED ORDER — DEXAMETHASONE SODIUM PHOSPHATE 10 MG/ML IJ SOLN
INTRAMUSCULAR | Status: AC
  Filled 2013-09-12: qty 1

## 2013-09-12 MED ORDER — DEXAMETHASONE SODIUM PHOSPHATE 10 MG/ML IJ SOLN
INTRAMUSCULAR | Status: DC | PRN
  Administered 2013-09-12: 10 mg via INTRAVENOUS

## 2013-09-12 MED ORDER — BUPIVACAINE HCL (PF) 0.25 % IJ SOLN
INTRAMUSCULAR | Status: DC | PRN
  Administered 2013-09-12: 8 mL

## 2013-09-12 MED ORDER — MIDAZOLAM HCL 2 MG/2ML IJ SOLN
INTRAMUSCULAR | Status: AC
  Filled 2013-09-12: qty 2

## 2013-09-12 MED ORDER — BUPIVACAINE HCL (PF) 0.5 % IJ SOLN
INTRAMUSCULAR | Status: AC
  Filled 2013-09-12: qty 30

## 2013-09-12 MED ORDER — KETOROLAC TROMETHAMINE 30 MG/ML IJ SOLN
INTRAMUSCULAR | Status: AC
  Filled 2013-09-12: qty 1

## 2013-09-12 MED ORDER — LIDOCAINE HCL (CARDIAC) 20 MG/ML IV SOLN
INTRAVENOUS | Status: AC
  Filled 2013-09-12: qty 5

## 2013-09-12 MED ORDER — METOCLOPRAMIDE HCL 5 MG/ML IJ SOLN: 10.0000 mg | Freq: Once | INTRAMUSCULAR | Status: DC | PRN

## 2013-09-12 MED ORDER — LIDOCAINE HCL (CARDIAC) 20 MG/ML IV SOLN
INTRAVENOUS | Status: DC | PRN
  Administered 2013-09-12: 50 mg via INTRAVENOUS

## 2013-09-12 MED ORDER — LACTATED RINGERS IV SOLN
INTRAVENOUS | Status: DC
Start: 2013-09-12 — End: 2013-09-12
  Administered 2013-09-12 (×2): via INTRAVENOUS

## 2013-09-12 MED ORDER — ONDANSETRON HCL 4 MG/2ML IJ SOLN
INTRAMUSCULAR | Status: AC
  Filled 2013-09-12: qty 2

## 2013-09-12 SURGICAL SUPPLY — 20 items
BENZOIN TINCTURE PRP APPL 2/3 (GAUZE/BANDAGES/DRESSINGS) IMPLANT
CATH ROBINSON RED A/P 16FR (CATHETERS) ×3 IMPLANT
CHLORAPREP W/TINT 26ML (MISCELLANEOUS) ×3 IMPLANT
CLIP FILSHIE TUBAL LIGA STRL (Clip) ×3 IMPLANT
CLOTH BEACON ORANGE TIMEOUT ST (SAFETY) ×3 IMPLANT
DRSG COVADERM PLUS 2X2 (GAUZE/BANDAGES/DRESSINGS) ×3 IMPLANT
GLOVE BIO SURGEON STRL SZ 6.5 (GLOVE) ×2 IMPLANT
GLOVE BIO SURGEONS STRL SZ 6.5 (GLOVE) ×1
GLOVE BIOGEL PI IND STRL 7.0 (GLOVE) ×1 IMPLANT
GLOVE BIOGEL PI INDICATOR 7.0 (GLOVE) ×2
GOWN STRL REUS W/TWL LRG LVL3 (GOWN DISPOSABLE) ×6 IMPLANT
NEEDLE INSUFFLATION 120MM (ENDOMECHANICALS) ×3 IMPLANT
PACK LAPAROSCOPY BASIN (CUSTOM PROCEDURE TRAY) ×3 IMPLANT
SET IRRIG TUBING LAPAROSCOPIC (IRRIGATION / IRRIGATOR) IMPLANT
SUT VICRYL 0 UR6 27IN ABS (SUTURE) ×3 IMPLANT
SUT VICRYL 4-0 PS2 18IN ABS (SUTURE) ×3 IMPLANT
TOWEL OR 17X24 6PK STRL BLUE (TOWEL DISPOSABLE) ×6 IMPLANT
TROCAR XCEL DIL TIP R 11M (ENDOMECHANICALS) ×3 IMPLANT
WARMER LAPAROSCOPE (MISCELLANEOUS) ×3 IMPLANT
WATER STERILE IRR 1000ML POUR (IV SOLUTION) ×3 IMPLANT

## 2013-09-12 NOTE — Transfer of Care (Signed)
Immediate Anesthesia Transfer of Care Note  Patient: Bianca GordonCynthia Ortiz  Procedure(s) Performed: Procedure(s): LAPAROSCOPIC TUBAL LIGATION (Bilateral)  Patient Location: PACU  Anesthesia Type:General  Level of Consciousness: awake  Airway & Oxygen Therapy: Patient Spontanous Breathing  Post-op Assessment: Report given to PACU RN  Post vital signs: stable  Filed Vitals:   09/12/13 1435  BP: 120/58  Pulse: 80  Temp: 36.6 C  Resp: 16    Complications: No apparent anesthesia complications

## 2013-09-12 NOTE — H&P (Signed)
Subjective:   Bianca Ortiz is a 28 y.o. female who presents for a postpartum visit. She is 3 week postpartum following a low cervical transverse Cesarean section. I have fully reviewed the prenatal and intrapartum course. The delivery was at 30 gestational weeks. Outcome: primary cesarean section, low transverse incision. Anesthesia: spinal. Postpartum course has been normal. Baby's course has been good, . Baby is feeding by breast. Bleeding no bleeding. Bowel function is normal. Bladder function is normal. Patient is not sexually active. Contraception method is abstinence. Postpartum depression screening: negative.  The following portions of the patient's history were reviewed and updated as appropriate: allergies, current medications, past family history, past medical history, past social history, past surgical history and problem list.  Past Medical History  Diagnosis Date  . Medical history non-contributory    Past Surgical History  Procedure Laterality Date  . No past surgeries    . Cesarean section N/A 07/13/2013    Procedure: CESAREAN SECTION;  Surgeon: Tilda BurrowJohn V Ferguson, MD;  Location: [facility];  Service: Obstetrics;  Laterality: N/A;   No current facility-administered medications on file prior to encounter.   Current Outpatient Prescriptions on File Prior to Encounter  Medication Sig Dispense Refill  . norethindrone (MICRONOR,CAMILA,ERRIN) 0.35 MG tablet Take 1 tablet (0.35 mg total) by mouth daily.  1 Package  11  . Prenatal Vit-Fe Fumarate-FA (PRENATAL MULTIVITAMIN) TABS tablet Take 1 tablet by mouth daily at 12 noon.      . docusate sodium (COLACE) 100 MG capsule Take 1 capsule (100 mg total) by mouth 2 (two) times daily as needed.  30 capsule  2  . ibuprofen (ADVIL,MOTRIN) 600 MG tablet Take 1 tablet (600 mg total) by mouth every 6 (six) hours as needed for mild pain or moderate pain.  60 tablet  2  . oxyCODONE-acetaminophen (PERCOCET/ROXICET) 5-325 MG per tablet Take 1-2 tablets by  mouth every 6 (six) hours as needed for severe pain (moderate - severe pain).  30 tablet  0     Review of Systems  Pertinent items are noted in HPI.  Objective:    Filed Vitals:   09/12/13 1435  BP: 120/58  Pulse: 80  Temp: 97.9 F (36.6 C)  TempSrc: Oral  Resp: 16  Height: 4\' 9"  (1.448 m)  Weight: 196 lb 2 oz (88.962 kg)  SpO2: 99%     General:  alert, cooperative and no distress   Breasts:    Lungs:  normal effort   Heart:    Abdomen:  soft, non-tender; bowel sounds normal; no masses, no organomegaly   Vulva:  not evaluated   Vagina:  not evaluated   Cervix:    Corpus:  not examined   Adnexa:  not evaluated   Rectal Exam:  Not performed.    Assessment:   normal postpartum exam. Requests sterilization Plan:   1. Contraception: tubal ligation. The procedure and the risk of anesthesia, bleeding, infection, bowel and bladder injury, failure (1/200) and ectopic pregnancy were discussed and her questions were answered.  2. abstinence    Adam PhenixJames G Aadvika Konen, MD  09/12/2013

## 2013-09-12 NOTE — Anesthesia Preprocedure Evaluation (Signed)
Anesthesia Evaluation  Patient identified by MRN, date of birth, ID band Patient awake    Reviewed: Allergy & Precautions, H&P , NPO status , Patient's Chart, lab work & pertinent test results  Airway Mallampati: III TM Distance: >3 FB Neck ROM: Full    Dental no notable dental hx. (+) Teeth Intact   Pulmonary neg pulmonary ROS,  breath sounds clear to auscultation  Pulmonary exam normal       Cardiovascular negative cardio ROS  Rhythm:Regular Rate:Normal     Neuro/Psych negative neurological ROS  negative psych ROS   GI/Hepatic negative GI ROS, Neg liver ROS,   Endo/Other  Morbid obesity  Renal/GU negative Renal ROS  negative genitourinary   Musculoskeletal negative musculoskeletal ROS (+)   Abdominal (+) + obese,   Peds  Hematology negative hematology ROS (+)   Anesthesia Other Findings   Reproductive/Obstetrics Desires sterilization                           Anesthesia Physical Anesthesia Plan  ASA: III  Anesthesia Plan: General   Post-op Pain Management:    Induction: Intravenous  Airway Management Planned: Oral ETT  Additional Equipment:   Intra-op Plan:   Post-operative Plan: Extubation in OR  Informed Consent: I have reviewed the patients History and Physical, chart, labs and discussed the procedure including the risks, benefits and alternatives for the proposed anesthesia with the patient or authorized representative who has indicated his/her understanding and acceptance.   Dental advisory given  Plan Discussed with: Anesthesiologist  Anesthesia Plan Comments:         Anesthesia Quick Evaluation

## 2013-09-12 NOTE — Discharge Instructions (Addendum)
Laparoscopic Tubal Ligation Care After Refer to this sheet in the next few weeks. These instructions provide you with information on caring for yourself after your procedure. Your caregiver may also give you more specific instructions. Your treatment has been planned according to current medical practices, but problems sometimes occur. Call your caregiver if you have any problems or questions after your procedure. HOME CARE INSTRUCTIONS   Rest the remainder of the day.  Only take over-the-counter or prescription medicines for pain, discomfort, or fever as directed by your caregiver. Do not take aspirin. It can cause bleeding.  Gradually resume daily activities, diet, rest, driving, and work.  Avoid sexual intercourse for 2 weeks or as directed.  Do not use tampons or douche.  Do not drive while taking pain medicine.  Do not lift anything over 5 pounds for 2 weeks or as directed.  Only take showers, not baths, until you are seen by your caregiver.  Change bandages (dressings) as directed.  Take your temperature twice a day and record it.  Try to have help for the first 7 to 10 days for your household needs.  Return to your caregiver to get your stitches (sutures) removed and for follow-up visits as directed. SEEK MEDICAL CARE IF:   You have redness, swelling, or increasing pain in a wound.  You have drainage from a wound lasting longer than 1 day.  Your pain is getting worse.  You have a rash.  You become dizzy or lightheaded.  You have a reaction to your medicine.  You need stronger medicine or a change in your pain medicine.  You notice a bad smell coming from a wound or dressing.  Your wound breaks open after the sutures have been removed.  You are constipated. SEEK IMMEDIATE MEDICAL CARE IF:   You faint.  You have a fever.  You have increasing abdominal pain.  You have severe pain in your shoulders.  You have bleeding or drainage from the suture sites or  vagina following surgery.  You have shortness of breath or difficulty breathing.  You have chest or leg pain.  You have persistent nausea, vomiting, or diarrhea. MAKE SURE YOU:   Understand these instructions.  Watch your condition.  Get help right away if you are not doing well or get worse. Document Released: 11/21/2004 Document Revised: 11/03/2011 Document Reviewed: 08/15/2011 [facility] Patient Information 2014 [employer], [place].     Do not take any ibuprofen products until 11 pm tonight

## 2013-09-12 NOTE — Op Note (Signed)
Procedure: Laparoscopic bilateral tubal ligation with Filshie clips Preoperative diagnosis: Undesired fertility Postoperative diagnosis: Surgical sterilization Surgeon: Dr. Scheryl DarterJames Kaydance Bowie Anesthesia: Gen. endotracheal by Dr. Mal AmabileMichael Foster Estimated blood loss: Negligible Specimens: None Complications: None Drains: None Counts: Correct   Patient gave written consent for laparoscopic bilateral tubal irrigation. Patient identification was confirmed and she was brought the operating room and general anesthesia was induced. His placed in dorsal lithotomy position. Exam revealed normal-size uterus. Abdomen perineum and vagina were sterilely prepped and draped. A Hulka tenaculum was placed on the cervix. 11 blade was used to make a infraumbilical vertical incision about 1/2 cm across. Abdominal wall was elevated and Veress needle was placed and CO2 was insufflated. The needle was removed and an 11 mm trocar was placed. Laparoscope was inserted with video camera and use. Panoramic view of the pelvis was obtained. She's placed in Trendelenburg and the uterus was manipulated with Hulka tenaculum. The right fallopian tube was identified and appeared normal. Filshie clip was placed about 3 cm from the cornu with good positioning seen. Same procedure was all left-sided. This was removed and CO2 was released from abdomen. Fascial stitch was placed with 0 Vicryl suture. Skin was closed with interrupted subcuticular sutures. 4-0 Vicryl was used. Sterile dressing was applied. Patient tolerated the procedure well and was brought in stable condition to PACU.  Dr. Scheryl DarterJames Lennyn Bellanca 09/12/2013 5:20 PM

## 2013-09-12 NOTE — Anesthesia Postprocedure Evaluation (Signed)
  Anesthesia Post-op Note  Patient: Bianca GordonCynthia Kise  Procedure(s) Performed: Procedure(s): LAPAROSCOPIC TUBAL LIGATION (Bilateral)  Patient Location: PACU  Anesthesia Type:General  Level of Consciousness: awake, alert  and oriented  Airway and Oxygen Therapy: Patient Spontanous Breathing  Post-op Pain: mild  Post-op Assessment: Post-op Vital signs reviewed, Patient's Cardiovascular Status Stable, Respiratory Function Stable, Patent Airway, No signs of Nausea or vomiting and Pain level controlled  Post-op Vital Signs: Reviewed and stable  Last Vitals:  Filed Vitals:   09/12/13 1435  BP: 120/58  Pulse: 80  Temp: 36.6 C  Resp: 16    Complications: No apparent anesthesia complications

## 2013-09-13 ENCOUNTER — Encounter (HOSPITAL_COMMUNITY): Payer: Self-pay | Admitting: Obstetrics & Gynecology

## 2013-11-06 ENCOUNTER — Encounter: Payer: Self-pay | Admitting: *Deleted

## 2014-01-23 ENCOUNTER — Ambulatory Visit: Payer: Self-pay

## 2014-01-23 ENCOUNTER — Other Ambulatory Visit: Payer: Self-pay | Admitting: Occupational Medicine

## 2014-01-23 DIAGNOSIS — R7612 Nonspecific reaction to cell mediated immunity measurement of gamma interferon antigen response without active tuberculosis: Secondary | ICD-10-CM

## 2014-03-19 ENCOUNTER — Encounter (HOSPITAL_COMMUNITY): Payer: Self-pay | Admitting: Obstetrics & Gynecology

## 2014-04-01 ENCOUNTER — Emergency Department (HOSPITAL_COMMUNITY): Payer: 59

## 2014-04-01 ENCOUNTER — Encounter (HOSPITAL_COMMUNITY): Payer: Self-pay | Admitting: Emergency Medicine

## 2014-04-01 ENCOUNTER — Emergency Department (HOSPITAL_COMMUNITY)
Admission: EM | Admit: 2014-04-01 | Discharge: 2014-04-02 | Disposition: A | Discharge status: Home / Self Care | Payer: 59 | Attending: Emergency Medicine | Admitting: Emergency Medicine

## 2014-04-01 DIAGNOSIS — R0602 Shortness of breath: Secondary | ICD-10-CM | POA: Insufficient documentation

## 2014-04-01 DIAGNOSIS — R079 Chest pain, unspecified: Secondary | ICD-10-CM | POA: Insufficient documentation

## 2014-04-01 DIAGNOSIS — R062 Wheezing: Secondary | ICD-10-CM

## 2014-04-01 DIAGNOSIS — R7989 Other specified abnormal findings of blood chemistry: Secondary | ICD-10-CM | POA: Diagnosis not present

## 2014-04-01 DIAGNOSIS — Z792 Long term (current) use of antibiotics: Secondary | ICD-10-CM | POA: Insufficient documentation

## 2014-04-01 DIAGNOSIS — Z3202 Encounter for pregnancy test, result negative: Secondary | ICD-10-CM | POA: Insufficient documentation

## 2014-04-01 LAB — CBC
HCT: 43 % (ref 36.0–46.0)
Hemoglobin: 14.8 g/dL (ref 12.0–15.0)
MCH: 27.9 pg (ref 26.0–34.0)
MCHC: 34.4 g/dL (ref 30.0–36.0)
MCV: 81.1 fL (ref 78.0–100.0)
PLATELETS: 361 10*3/uL (ref 150–400)
RBC: 5.3 MIL/uL — AB (ref 3.87–5.11)
RDW: 13.5 % (ref 11.5–15.5)
WBC: 10.2 10*3/uL (ref 4.0–10.5)

## 2014-04-01 LAB — BASIC METABOLIC PANEL
Anion gap: 14 (ref 5–15)
BUN: 8 mg/dL (ref 6–23)
CALCIUM: 9.7 mg/dL (ref 8.4–10.5)
CO2: 24 meq/L (ref 19–32)
Chloride: 100 mEq/L (ref 96–112)
Creatinine, Ser: 0.54 mg/dL (ref 0.50–1.10)
GFR calc non Af Amer: >90 mL/min (ref >90)
Glucose, Bld: 99 mg/dL (ref 70–99)
Potassium: 3.9 mEq/L (ref 3.7–5.3)
Sodium: 138 mEq/L (ref 137–147)

## 2014-04-01 LAB — I-STAT TROPONIN, ED: TROPONIN I, POC: 0.02 ng/mL (ref 0.00–0.08)

## 2014-04-01 LAB — POC URINE PREG, ED: Preg Test, Ur: NEGATIVE

## 2014-04-01 LAB — D-DIMER, QUANTITATIVE: D-Dimer, Quant: 0.68 ug/mL-FEU — ABNORMAL HIGH (ref 0.00–0.48)

## 2014-04-01 MED ORDER — SODIUM CHLORIDE 0.9 % IV BOLUS (SEPSIS)
1000.0000 mL | Freq: Once | INTRAVENOUS | Status: AC
  Administered 2014-04-02: 1000 mL via INTRAVENOUS

## 2014-04-01 MED ORDER — ALBUTEROL SULFATE (2.5 MG/3ML) 0.083% IN NEBU
2.5000 mg | INHALATION_SOLUTION | Freq: Once | RESPIRATORY_TRACT | Status: AC
  Administered 2014-04-01: 2.5 mg via RESPIRATORY_TRACT
  Filled 2014-04-01: qty 3

## 2014-04-01 NOTE — ED Notes (Signed)
Patient with chest pain and shortness of breath that started earlier today.  Patient states that the shortness of breath started first.  Patient states that the chest pain is a heaviness feeling.

## 2014-04-01 NOTE — ED Notes (Signed)
Patient states she is breathing better

## 2014-04-01 NOTE — ED Provider Notes (Signed)
CSN: [id number]     Arrival date & time 04/01/14  1916 History   First MD Initiated Contact with Patient 04/01/14 2125     Chief Complaint  Patient presents with  . Chest Pain  . Shortness of Breath    Patient is a 28 y.o. female presenting with shortness of breath. The history is provided by the patient.  Shortness of Breath Severity:  Severe Onset quality:  Sudden Duration:  1 day Timing:  Constant Progression:  Unchanged Chronicity:  New Context: weather changes   Context: not URI   Relieved by:  None tried Worsened by:  Nothing tried Ineffective treatments:  None tried Associated symptoms: chest pain, cough and wheezing   Associated symptoms: no abdominal pain, no fever and no vomiting   Chest pain:    Quality:  Aching   Severity:  Moderate   Onset quality:  Gradual   Duration:  12 hours   Timing:  Constant   Progression:  Waxing and waning (worse with coughing)   Chronicity:  New Risk factors: obesity   Risk factors: no hx of cancer, no hx of PE/DVT, no oral contraceptive use, no prolonged immobilization and no recent surgery     Past Medical History  Diagnosis Date  . Medical history non-contributory    Past Surgical History  Procedure Laterality Date  . No past surgeries    . Cesarean section N/A 07/13/2013    Procedure: CESAREAN SECTION;  Surgeon: Tilda BurrowJohn V Ferguson, MD;  Location: [facility];  Service: Obstetrics;  Laterality: N/A;  . Laparoscopic tubal ligation Bilateral 09/12/2013    Procedure: LAPAROSCOPIC TUBAL LIGATION;  Surgeon: Adam PhenixJames G Arnold, MD;  Location: [facility];  Service: Gynecology;  Laterality: Bilateral;   No family history on file. History  Substance Use Topics  . Smoking status: Never Smoker   . Smokeless tobacco: Not on file  . Alcohol Use: No   OB History    Gravida Para Term Preterm AB TAB SAB Ectopic Multiple Living   5 4 3 1 1 1    3      Review of Systems  Constitutional: Negative for fever.  Respiratory: Positive for cough, shortness  of breath and wheezing.   Cardiovascular: Positive for chest pain.  Gastrointestinal: Negative for nausea, vomiting and abdominal pain.  All other systems reviewed and are negative.   Allergies  Review of patient's allergies indicates no known allergies.  Home Medications   Prior to Admission medications   Medication Sig Start Date End Date Taking? Authorizing Provider  docusate sodium (COLACE) 100 MG capsule Take 1 capsule (100 mg total) by mouth 2 (two) times daily as needed. Patient taking differently: Take 100 mg by mouth 2 (two) times daily as needed for mild constipation.  07/16/13  Yes Tereso NewcomerUgonna A Anyanwu, MD  ibuprofen (ADVIL,MOTRIN) 600 MG tablet Take 1 tablet (600 mg total) by mouth every 6 (six) hours as needed for mild pain or moderate pain. 07/16/13  Yes Tereso NewcomerUgonna A Anyanwu, MD  rifampin (RIFADIN) 150 MG capsule Take 150 mg by mouth daily.   Yes Historical Provider, MD   BP 143/95 mmHg  Pulse 103  Temp(Src) 97.5 F (36.4 C) (Oral)  Resp 16  Ht 4\' 10"  (1.473 m)  Wt 199 lb 11.2 oz (90.583 kg)  BMI 41.75 kg/m2  SpO2 96%  LMP 03/12/2014 (Exact Date)   Physical Exam  Constitutional: She is oriented to person, place, and time. She appears well-developed and well-nourished. She is cooperative.  Non-toxic appearance.  She does not have a sickly appearance. She does not appear ill. No distress.  HENT:  Head: Normocephalic and atraumatic.  Right Ear: External ear normal.  Left Ear: External ear normal.  Nose: Nose normal.  Mouth/Throat: Oropharynx is clear and moist.  Neck: Normal range of motion and phonation normal.  Cardiovascular: Normal rate and regular rhythm.   Pulmonary/Chest: Effort normal. No respiratory distress. She has no decreased breath sounds. She has wheezes (in all fields; prolonged expiration). She has no rales.  Abdominal: Soft. She exhibits no distension. There is no tenderness. There is no rebound and no guarding.  Neurological: She is alert and oriented to  person, place, and time. GCS eye subscore is 4. GCS verbal subscore is 5. GCS motor subscore is 6.  Skin: Skin is warm and dry. No rash noted. She is not diaphoretic.  Psychiatric: She has a normal mood and affect.    ED Course  Procedures (including critical care time) Labs Review Labs Reviewed  CBC - Abnormal; Notable for the following:    RBC 5.30 (*)    All other components within normal limits  D-DIMER, QUANTITATIVE - Abnormal; Notable for the following:    D-Dimer, Quant 0.68 (*)    All other components within normal limits  BASIC METABOLIC PANEL  I-STAT TROPOININ, ED  POC URINE PREG, ED  Rosezena SensorI-STAT TROPOININ, ED    Imaging Review Dg Chest 2 View  04/01/2014   CLINICAL DATA:  Chest pain and shortness of breath.  EXAM: CHEST  2 VIEW  COMPARISON:  01/23/2014; 12/20/2012  FINDINGS: Grossly unchanged cardiac silhouette and mediastinal contours. No focal airspace opacities. No pleural effusion or pneumothorax. No evidence of edema. No acute osseus abnormalities. Post cholecystectomy. Regional soft tissues appear normal.  IMPRESSION: No acute cardiopulmonary disease.   Electronically Signed   By: Simonne ComeJohn  Watts M.D.   On: 04/01/2014 20:25   Ct Angio Chest Pe W/cm &/or Wo Cm  04/02/2014   CLINICAL DATA:  Chest pain, shortness of breath beginning last night.  EXAM: CT ANGIOGRAPHY CHEST WITH CONTRAST  TECHNIQUE: Multidetector CT imaging of the chest was performed using the standard protocol during bolus administration of intravenous contrast. Multiplanar CT image reconstructions and MIPs were obtained to evaluate the vascular anatomy.  CONTRAST:  80mL OMNIPAQUE IOHEXOL 350 MG/ML SOLN  COMPARISON:  Chest radiograph April 01, 2014  FINDINGS: Large body habitus results in overall noisy image quality. Mild respiratory motion degraded examination. Adequate contrast opacification of the pulmonary artery's. Main pulmonary artery is not enlarged. No pulmonary arterial filling defects to the level of the  subsegmental branches.  Heart and pericardium are unremarkable, no right heart strain. Thoracic aorta is normal course and caliber, unremarkable. No lymphadenopathy by CT size criteria. Subcentimeter aortopulmonary window lymph nodes. Amorphous soft tissue within the anterior mediastinum likely reflects residual thymic tissue. Tracheobronchial tree is patent, no pneumothorax. Patchy ground-glass opacities and RIGHT upper lobe, bilateral lower lobes. No pleural effusions.  Included view of the abdomen is nonsuspicious. Probable fatty liver. Visualized soft tissues and included osseous structures appear normal.  Review of the MIP images confirms the above findings.  IMPRESSION: Mild respiratory motion degraded examination. No acute pulmonary embolism.  Patchy ground-glass opacities may be infectious or inflammatory.   Electronically Signed   By: Awilda Metroourtnay  Bloomer   On: 04/02/2014 01:02    EKG Sinus tachycardia, Rate 110, PR 144, QRS 74, QTc 430, No ST elevation or depression  MDM   Final diagnoses:  Positive D  dimer  Shortness of breath  Wheezing    28 y.o. otherwise healthy female presents to the ED due to sudden onset shortness of breath that began yesterday, chest pain that began today. She has no history of asthma. Endorses cough. Reports that the chest pain is worse with coughing. No fever or chills. No nausea, vomiting, diarrhea, abdominal pain.  Exam as above. Notable for wheezing. No distress noted. Normal oxygen saturation on room air.    Concern for PE, reactive airway disease, pneumonia.  Chest x-ray shows no acute cardiopulmonary disease. D-dimer elevated. Plan to obtain a CTA of the chest.  CT of the chest shows no acute pulmonary embolism. Shows patchy ground glass opacities which I favor to be inflammatory given her presentation.  CBC unremarkable, BMP unremarkable. UPT negative. Initial troponin negative. Delta troponin negative.   Will give albuterol nebulizer for wheezing.  Will give PO steroids. On reevaluation the patient is breathing comfortably, endorses feeling much better, no pain. No further wheezing.  I discussed strict return precautions with the patient and these were given in writing as well. She was given an albuterol inhaler in the ED. She was given a prescription for prednisone to complete a total of 5 days. She was given a resource guide to establish with a primary care physician. All questions answered. She was discharged home.  This case was managed in conjunction with my attending, Dr. Ethelda Chick.         Maxine Glenn, MD 04/02/14 1610  Doug Sou, MD 04/02/14 1344

## 2014-04-01 NOTE — ED Provider Notes (Signed)
Patient complains of anterior pleuritic chest pain and shortness of breath onset yesterday. Also admits to slight cough. No other associated symptoms. On exam alert no distress lungs clear auscultation heart regular rate and rhythm no murmurs abdomen obese nontender extremities without edema  Doug SouSam Kikue Gerhart, MD 04/01/14 2317

## 2014-04-01 NOTE — ED Notes (Signed)
Patient transported to X-ray 

## 2014-04-02 ENCOUNTER — Encounter (HOSPITAL_COMMUNITY): Payer: Self-pay | Admitting: Radiology

## 2014-04-02 ENCOUNTER — Emergency Department (HOSPITAL_COMMUNITY): Payer: 59

## 2014-04-02 LAB — I-STAT TROPONIN, ED: Troponin i, poc: 0.01 ng/mL (ref 0.00–0.08)

## 2014-04-02 MED ORDER — ALBUTEROL SULFATE HFA 108 (90 BASE) MCG/ACT IN AERS
2.0000 | INHALATION_SPRAY | Freq: Once | RESPIRATORY_TRACT | Status: AC
  Administered 2014-04-02: 2 via RESPIRATORY_TRACT
  Filled 2014-04-02: qty 6.7

## 2014-04-02 MED ORDER — PREDNISONE 20 MG PO TABS
60.0000 mg | ORAL_TABLET | Freq: Once | ORAL | Status: AC
  Administered 2014-04-02: 60 mg via ORAL
  Filled 2014-04-02: qty 3

## 2014-04-02 MED ORDER — PREDNISONE 10 MG PO TABS: 40.0000 mg | ORAL_TABLET | Freq: Every day | ORAL | Status: DC

## 2014-04-02 MED ORDER — IOHEXOL 350 MG/ML SOLN
80.0000 mL | Freq: Once | INTRAVENOUS | Status: AC | PRN
  Administered 2014-04-02: 80 mL via INTRAVENOUS

## 2014-04-02 NOTE — Discharge Instructions (Signed)
Chest Pain (Nonspecific) °It is often hard to give a specific diagnosis for the cause of chest pain. There is always a chance that your pain could be related to something serious, such as a heart attack or a blood clot in the lungs. You need to follow up with your health care provider for further evaluation. °CAUSES  °· Heartburn. °· Pneumonia or bronchitis. °· Anxiety or stress. °· Inflammation around your heart (pericarditis) or lung (pleuritis or pleurisy). °· A blood clot in the lung. °· A collapsed lung (pneumothorax). It can develop suddenly on its own (spontaneous pneumothorax) or from trauma to the chest. °· Shingles infection (herpes zoster virus). °The chest wall is composed of bones, muscles, and cartilage. Any of these can be the source of the pain. °· The bones can be bruised by injury. °· The muscles or cartilage can be strained by coughing or overwork. °· The cartilage can be affected by inflammation and become sore (costochondritis). °DIAGNOSIS  °Lab tests or other studies may be needed to find the cause of your pain. Your health care provider may have you take a test called an ambulatory electrocardiogram (ECG). An ECG records your heartbeat patterns over a 24-hour period. You may also have other tests, such as: °· Transthoracic echocardiogram (TTE). During echocardiography, sound waves are used to evaluate how blood flows through your heart. °· Transesophageal echocardiogram (TEE). °· Cardiac monitoring. This allows your health care provider to monitor your heart rate and rhythm in real time. °· Holter monitor. This is a portable device that records your heartbeat and can help diagnose heart arrhythmias. It allows your health care provider to track your heart activity for several days, if needed. °· Stress tests by exercise or by giving medicine that makes the heart beat faster. °TREATMENT  °· Treatment depends on what may be causing your chest pain. Treatment may include: °¨ Acid blockers for  heartburn. °¨ Anti-inflammatory medicine. °¨ Pain medicine for inflammatory conditions. °¨ Antibiotics if an infection is present. °· You may be advised to change lifestyle habits. This includes stopping smoking and avoiding alcohol, caffeine, and chocolate. °· You may be advised to keep your head raised (elevated) when sleeping. This reduces the chance of acid going backward from your stomach into your esophagus. °Most of the time, nonspecific chest pain will improve within 2-3 days with rest and mild pain medicine.  °HOME CARE INSTRUCTIONS  °· If antibiotics were prescribed, take them as directed. Finish them even if you start to feel better. °· For the next few days, avoid physical activities that bring on chest pain. Continue physical activities as directed. °· Do not use any tobacco products, including cigarettes, chewing tobacco, or electronic cigarettes. °· Avoid drinking alcohol. °· Only take medicine as directed by your health care provider. °· Follow your health care provider's suggestions for further testing if your chest pain does not go away. °· Keep any follow-up appointments you made. If you do not go to an appointment, you could develop lasting (chronic) problems with pain. If there is any problem keeping an appointment, call to reschedule. °SEEK MEDICAL CARE IF:  °· Your chest pain does not go away, even after treatment. °· You have a rash with blisters on your chest. °· You have a fever. °SEEK IMMEDIATE MEDICAL CARE IF:  °· You have increased chest pain or pain that spreads to your arm, neck, jaw, back, or abdomen. °· You have shortness of breath. °· You have an increasing cough, or you cough   up blood. °· You have severe back or abdominal pain. °· You feel nauseous or vomit. °· You have severe weakness. °· You faint. °· You have chills. °This is an emergency. Do not wait to see if the pain will go away. Get medical help at once. Call your local emergency services (911 in [place] Do not drive  yourself to the hospital. °MAKE SURE YOU:  °· Understand these instructions. °· Will watch your condition. °· Will get help right away if you are not doing well or get worse. °Document Released: 02/11/2005 Document Revised: 05/09/2013 Document Reviewed: 12/08/2007 °ExitCare® Patient Information ©2015 [employer]. This information is not intended to replace advice given to you by your health care provider. Make sure you discuss any questions you have with your health care provider. ° °Shortness of Breath °Shortness of breath means you have trouble breathing. It could also mean that you have a medical problem. You should get immediate medical care for shortness of breath. °CAUSES  °· Not enough oxygen in the air such as with high altitudes or a smoke-filled room. °· Certain lung diseases, infections, or problems. °· Heart disease or conditions, such as angina or heart failure. °· Low red blood cells (anemia). °· Poor physical fitness, which can cause shortness of breath when you exercise. °· Chest or back injuries or stiffness. °· Being overweight. °· Smoking. °· Anxiety, which can make you feel like you are not getting enough air. °DIAGNOSIS  °Serious medical problems can often be found during your physical exam. Tests may also be done to determine why you are having shortness of breath. Tests may include: °· Chest X-rays. °· Lung function tests. °· Blood tests. °· An electrocardiogram (ECG). °· An ambulatory electrocardiogram. An ambulatory ECG records your heartbeat patterns over a 24-hour period. °· Exercise testing. °· A transthoracic echocardiogram (TTE). During echocardiography, sound waves are used to evaluate how blood flows through your heart. °· A transesophageal echocardiogram (TEE). °· Imaging scans. °Your health care provider may not be able to find a cause for your shortness of breath after your exam. In this case, it is important to have a follow-up exam with your health care provider as directed.    °TREATMENT  °Treatment for shortness of breath depends on the cause of your symptoms and can vary greatly. °HOME CARE INSTRUCTIONS  °· Do not smoke. Smoking is a common cause of shortness of breath. If you smoke, ask for help to quit. °· Avoid being around chemicals or things that may bother your breathing, such as paint fumes and dust. °· Rest as needed. Slowly resume your usual activities. °· If medicines were prescribed, take them as directed for the full length of time directed. This includes oxygen and any inhaled medicines. °· Keep all follow-up appointments as directed by your health care provider. °SEEK MEDICAL CARE IF:  °· Your condition does not improve in the time expected. °· You have a hard time doing your normal activities even with rest. °· You have any new symptoms. °SEEK IMMEDIATE MEDICAL CARE IF:  °· Your shortness of breath gets worse. °· You feel light-headed, faint, or develop a cough not controlled with medicines. °· You start coughing up blood. °· You have pain with breathing. °· You have chest pain or pain in your arms, shoulders, or abdomen. °· You have a fever. °· You are unable to walk up stairs or exercise the way you normally do. °MAKE SURE YOU: °· Understand these instructions. °· Will watch your   condition. °· Will get help right away if you are not doing well or get worse. °Document Released: 01/27/2001 Document Revised: 05/09/2013 Document Reviewed: 07/20/2011 °ExitCare® Patient Information ©2015 [employer]. This information is not intended to replace advice given to you by your health care provider. Make sure you discuss any questions you have with your health care provider. ° ° °Emergency Department Resource Guide °1) Find a Doctor and Pay Out of Pocket °Although you won't have to find out who is covered by your insurance plan, it is a good idea to ask around and get recommendations. You will then need to call the office and see if the doctor you have chosen will accept you as a  new patient and what types of options they offer for patients who are self-pay. Some doctors offer discounts or will set up payment plans for their patients who do not have insurance, but you will need to ask so you aren't surprised when you get to your appointment. ° °2) Contact Your Local Health Department °Not all health departments have doctors that can see patients for sick visits, but many do, so it is worth a call to see if yours does. If you don't know where your local health department is, you can check in your phone book. The CDC also has a tool to help you locate your state's health department, and many state websites also have listings of all of their local health departments. ° °3) Find a Walk-in Clinic °If your illness is not likely to be very severe or complicated, you may want to try a walk in clinic. These are popping up all over the country in pharmacies, drugstores, and shopping centers. They're usually staffed by nurse practitioners or physician assistants that have been trained to treat common illnesses and complaints. They're usually fairly quick and inexpensive. However, if you have serious medical issues or chronic medical problems, these are probably not your best option. ° °No Primary Care Doctor: °- Call [facility] at  832-8000 - they can help you locate a primary care doctor that  accepts your insurance, provides certain services, etc. °- Physician Referral Service- 1-800-533-3463 ° °Chronic Pain Problems: °Organization         Address  Phone   Notes  °[facility]  ([phone]) [phone] Patients need to be referred by their primary care doctor.  ° °Medication Assistance: °Organization         Address  Phone   Notes  °[place] [facility] [address] °[place], [place] [address] (336) 641-8030 --Must be a resident of [place] °-- Must have NO insurance coverage whatsoever (no Medicaid/ Medicare, etc.) °-- The pt. MUST have a  primary care doctor that directs their care regularly and follows them in the community °  °[facility]  (866) 331-1348   °[employer]  (888) 892-1162   ° °Agencies that provide inexpensive medical care: °Organization         Address  Phone   Notes  °[facility]  (336) 832-8035   °[facility]    (336) 832-7272   °[facility] [address] °[place], [place] [address] (336) 832-4777   °[facility] [address], °[place] (336) 271-4999   °[facility]    (336) 373-0678   °[facility]    (336) 272-1050   °[facility] ° [address], [place] Phone:  (  336) 832-4444, Fax:  (336) 832-4440 Hours of Operation:  9 am - 6 pm, M-F.  Also accepts Medicaid/Medicare and self-pay.  °[facility] ° [address], [place] Phone: (336) [phone], Fax: (336) [phone]. Hours of Operation:  8:30 am - 5:30 pm, M-F.  Also accepts Medicaid and self-pay.  °[facility] [place] [address], [place] Phone: (336) 878-6027   °[facility] [address], [place], [place] (336)723-1848, Ext. 123 Mondays & Thursdays: 7-9 AM.  First 15 patients are seen on a first come, first serve basis. °  ° °Medicaid-accepting [place] Providers: ° °Organization         Address  Phone   Notes  °[facility] [address], [place] (336) 641-2100 Also accepts self-pay patients.  °[facility] [address], [place] ° (336) 856-9996   °[facility] [address], [place] (336) 288-8857   °[facility] [address] [place] [address], [place] (336) 299-7000   °Veita Bland [address], [place]  ° (336) 373-1557 Only accepts [place] Access Medicaid patients after they have their name applied to their card.  ° °Self-Pay (no insurance) in [place]: ° °Organization         Address  Phone   Notes  °Sickle Cell Patients, [facility] [address], [place] (336) 832-1970   °[facility] [address], [place] (336) 832-4400   °[facility] [place] ° [address], [place] (336) 992-4800   °[facility]/Dr. Osei-Bonsu ° [address] [place] [address], [place] or [address], [place] (336) [phone] Phone number for both [place] and [place] locations is the same.  °[facility] [address], [place] (336) 299-0000   °[facility] [address] [place] [address], [place] or [address] (336) 852-7530 °(336) 878-2260   °[facility] [address], [place] (336) 350-1642, phone; (336) 294-5005, fax Sees patients 1st and 3rd Saturday of every month.  Must not qualify for public or private insurance (i.e. Medicaid, Medicare, [place] Health Choice, Veterans' Benefits) • Household income should be no more than 200% of the poverty level •The clinic cannot treat you if you are pregnant or think you are pregnant • Sexually transmitted diseases are not treated at the clinic.  ° ° °Dental Care: °Organization         Address  Phone  Notes  °[facility] [address],  (336) 641-6152 Accepts children up to age 21 who are enrolled in Medicaid or [place] Health Choice; pregnant women with a Medicaid card; and children who have applied for Medicaid or [place] Health Choice, but were declined, whose parents can pay a reduced fee at time of service.  °[facility] [place]  [address], [place] (336) 641-7733 Accepts children up to age 21 who are enrolled in Medicaid or [place] Health Choice; pregnant women with a Medicaid card; and children who have applied for Medicaid or [place] Health Choice, but were declined, whose parents can  pay a reduced fee at time of service.  °[facility] ° [address],  (336) 641-4533 Patients are seen by appointment only. Walk-ins   are not accepted. [facility] will see patients 18 years of age and older. °Monday - Tuesday (8am-5pm) °Most Wednesdays (8:30-5pm) °$30 per visit, cash only  °[facility] ° [address], [place] (336) [phone] Patients are seen by appointment only. Walk-ins are not accepted. [facility] will see patients 18 years of age and older. °One Wednesday Evening (Monthly: Volunteer Based).  $30 per visit, cash only  °[facility]  (919) [phone] for adults; Children under age 4, call Graduate Pediatric Dentistry at ([phone]) [phone]. Children aged 4-14, please call ([phone]) [phone] to request a pediatric application. ° Dental services are provided in all areas of dental care including fillings, crowns and bridges, complete and partial dentures, implants, gum treatment, root canals, and extractions. Preventive care is also provided. Treatment is provided to both adults and children. °Patients are selected via a lottery and there is often a waiting list. °  °[facility] [address], °[place] ° (336) 763-8833 [email or web address] °  °[facility] [address], [place], [place] (336)723-1848, Ext. 123 Second and Fourth Thursday of each month, opens at 6:30 AM; Clinic ends at 9 AM.  Patients are seen on a first-come first-served basis, and a limited number are seen during each clinic.  ° °[facility] ° [address], [place], [place] (336) [phone]   Eligibility Requirements °You must have lived in [place], or [place] counties for at least the last three months. °  You cannot be eligible for state or federal sponsored healthcare insurance, including Veterans Administration, Medicaid, or Medicare. °  You generally cannot be eligible for healthcare  insurance through your employer.  °  How to apply: °Eligibility screenings are held every Tuesday and Wednesday afternoon from 1:00 pm until 4:00 pm. You do not need an appointment for the interview!  °[facility] [address], [place], [place] 336-631-2330   °[facility]  336-342-8273   °[facility]  336-703-3100   °[facility]  336-570-6415   ° °Behavioral Health Resources in the Community: °Intensive Outpatient Programs °Organization         Address  Phone  Notes  °[facility] [address], [place], [place] 336-878-6098   °[facility] [address], [place], [place] 336-832-9800   °[facility] [address], [place], [place] ° 336-882-2125   °[facility] [address],  °[place], [place] 1-800-853-5163 or 336-641-4981   °Substance Abuse Resources °Organization         Address  Phone  Notes  °[facility]  336-882-2125   °[facility]  336-784-9470   °[facility]  336-285-9073   °[facility]  336-845-3988   °Residential & Outpatient Substance Abuse Program  1-800-659-3381   °Psychological Services °Organization         Address  Phone  Notes  °[facility]  336- 832-9600   °[facility]  336- 378-7881   °[facility] [address], [place] 1-800-853-5163 or 336-641-4981   ° °Mobile Crisis Teams °Organization         Address  Phone  Notes  °[facility], Mobile Crisis Care Unit  1-877-626-1772   °Assertive °[facility] ° [address] [place], [place] 336-834-9664   °Sharon DeEsch [address] °[place] 336-554-5454   ° °Self-Help/Support   Groups °Organization         Address  Phone             Notes  °[facility] - variety of support groups  336- 373-1402 Call for more information  °Narcotics Anonymous (NA),  [facility] [address], °[place]  2 meetings at this location  ° °Residential Treatment Programs °Organization         Address  Phone  Notes  °[facility] [address],    °[place]  1-866-801-8205   °[facility] ° [address], [place], [place] 704-293-8524   °[facility] [address], [place] 336-845-3988 Admissions: 8am-3pm M-F  °[facility] [address]    °[place], [place] 336-841-1104   °[facility] [address], [place], [place] 336-379-7146   °[facility] [address]  °[place], [place] 336-285-9073   °[facility] - Intensive Outpatient [address], [place], [place] 336-852-3033   °[facility] [address]  °[place], [place] 1-877-615-2722 or 336-784-9470   °[facility] [address] [place], [place] 336-227-7417 Accepts Medicaid  °[facility] [address]  °[place] 1-800-659-3381 Substance Abuse/Addiction Treatment  ° °[place] Behavioral Health Resources °Organization         Address  Phone  Notes  °[facility]  (888) 581-9988   °Julie Brannon, PhD [address] [place], [place]   (336) 349-5553 or (336) 951-0000   °[facility]   [address] °[place], [place] (336) 349-4454   °[facility] [address], [place], [place] (336) 342-8316 Insurance/Medicaid/sponsorship through [facility]  °[facility] [address]                                    [place], [place] (336) 342-8316 Therapy/tele-psych/case  °[facility] [address]  ° [place],  (336) 349-2233    °Dr. Arfeen  (336) 349-4544   °[facility] [place]  [employer] [place] [facility] 1) [address], [place] °2) [address], [place] °3)  [address], [place] (336) 349-3220 °(336) 342-7768 ° °(336) 342-8140    °[place] [facility] (336) 342-1394 or (336) 342-3537 (After Hours)    ° °  °

## 2014-07-20 IMAGING — CR DG CHEST 2V
2 series · 2 of 2 positions shown · non-contrast
Comparison: None

CLINICAL DATA: Shortness of breath.

CHEST - 2 VIEW

[w chest pa]
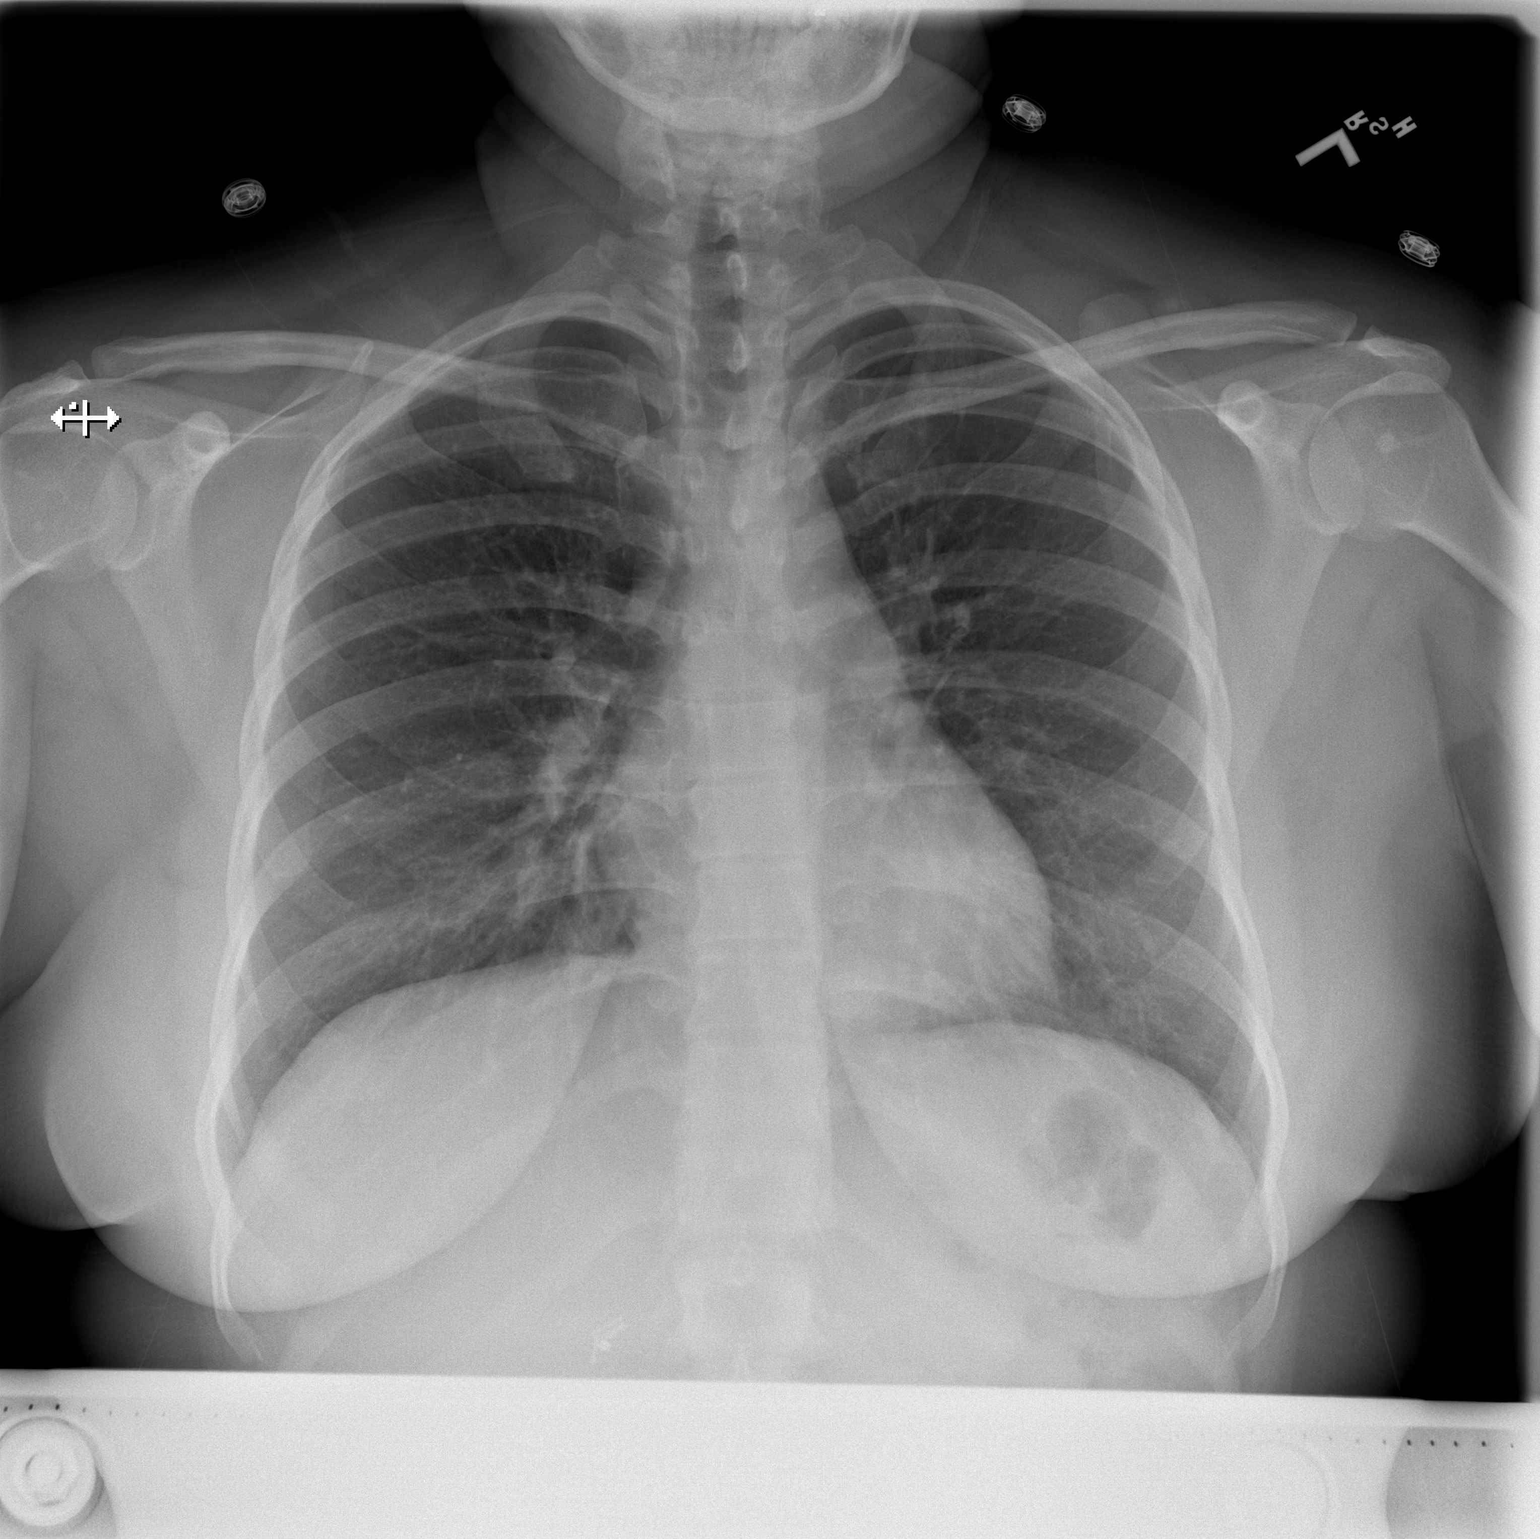

[w chest lat]
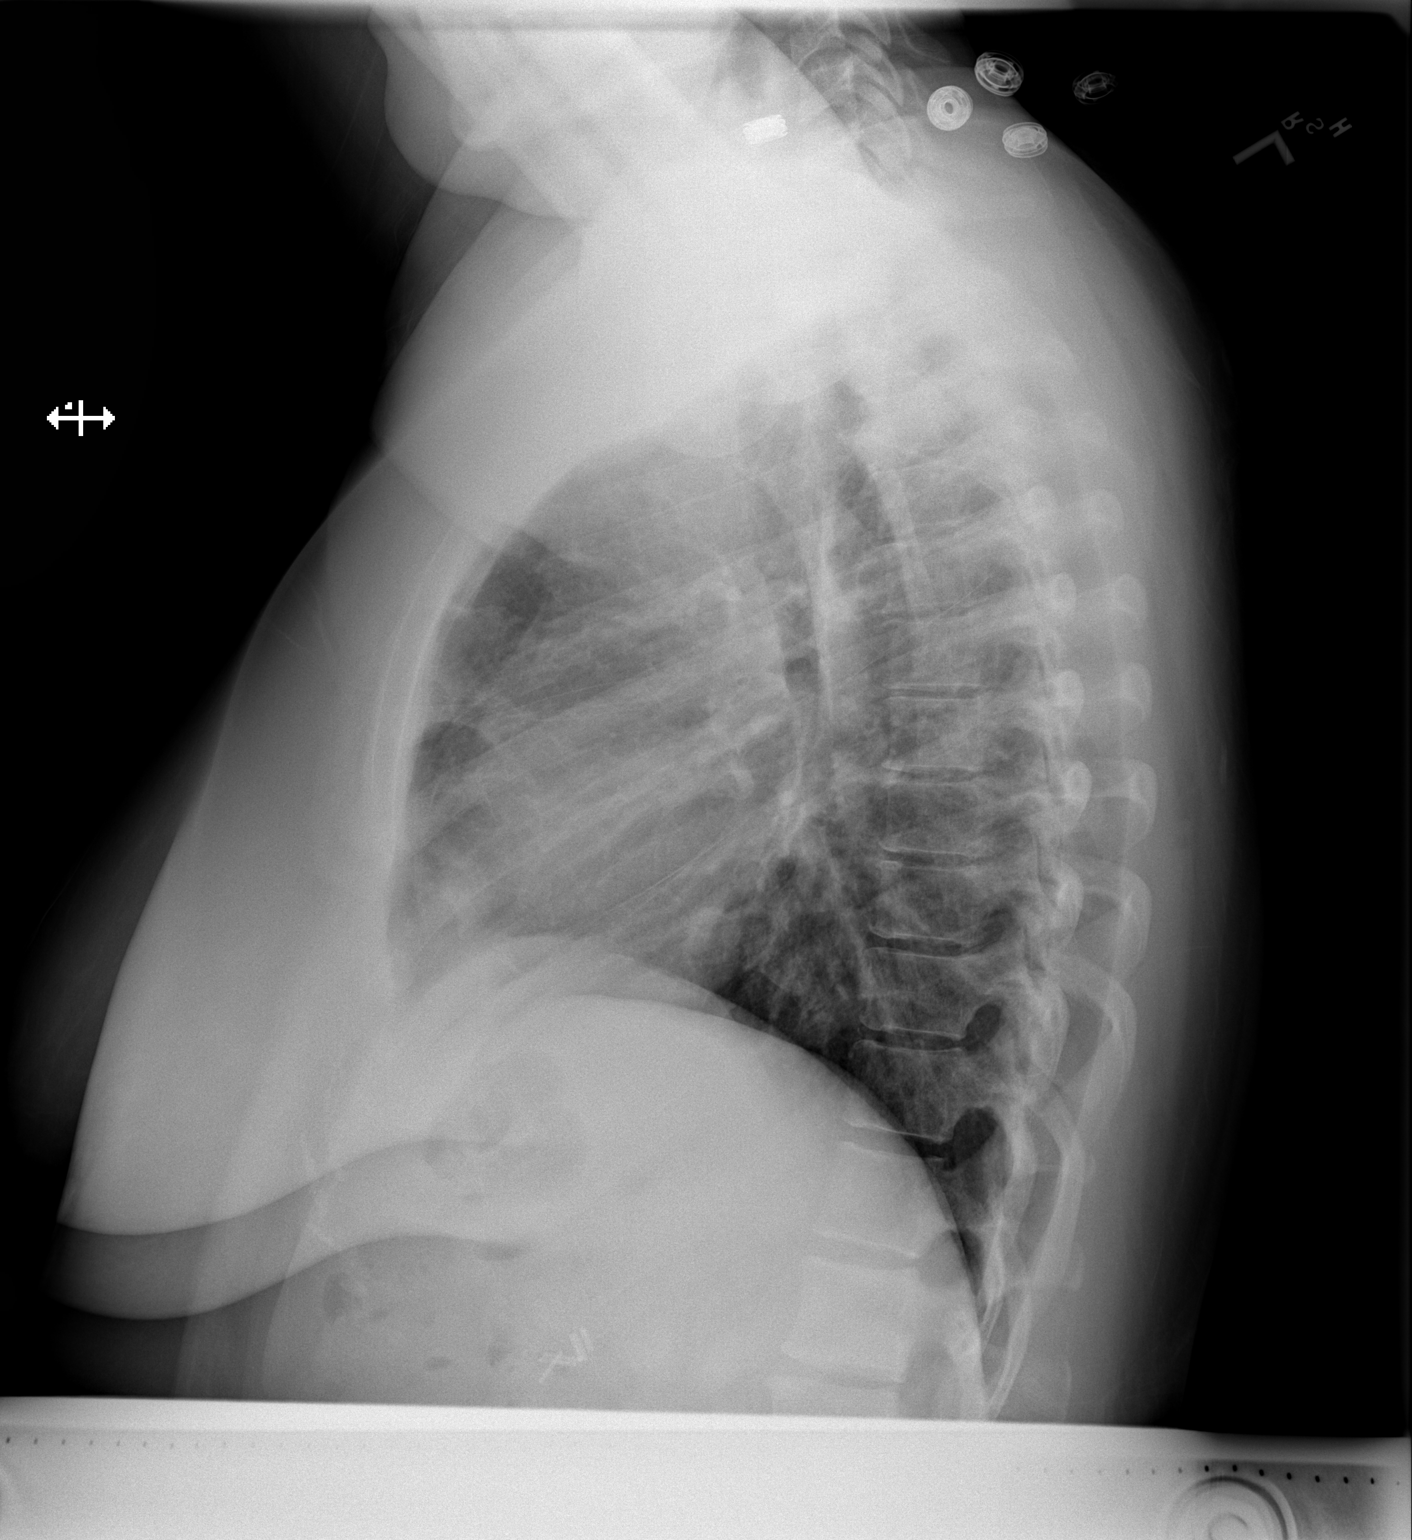

[2 of 2 positions shown; findings below may reference images not displayed]

FINDINGS: Heart and mediastinal contours are within normal limits.
No focal opacities or effusions.  No acute bony abnormality.  Mild
central peribronchial thickening.
IMPRESSION: Mild bronchitic changes.

## 2015-05-30 MED FILL — TRANEXAMIC ACID 650 MG TAB: 650 | 5 days supply | Qty: 30 | Fill #2

## 2015-06-28 MED FILL — TRANEXAMIC ACID 650 MG TAB: 650 | 5 days supply | Qty: 30 | Fill #3

## 2015-07-26 MED FILL — TRANEXAMIC ACID 650 MG TAB: 650 | 5 days supply | Qty: 30 | Fill #0

## 2015-09-16 MED FILL — TRANEXAMIC ACID 650 MG TAB: 650 | 5 days supply | Qty: 30 | Fill #1

## 2015-10-17 MED FILL — TRANEXAMIC ACID 650 MG TAB: 650 | 5 days supply | Qty: 30 | Fill #2

## 2016-05-27 DIAGNOSIS — R102 Pelvic and perineal pain: Secondary | ICD-10-CM | POA: Diagnosis not present

## 2016-05-27 DIAGNOSIS — Z01419 Encounter for gynecological examination (general) (routine) without abnormal findings: Secondary | ICD-10-CM | POA: Diagnosis not present

## 2016-05-27 DIAGNOSIS — Z6841 Body Mass Index (BMI) 40.0 and over, adult: Secondary | ICD-10-CM | POA: Diagnosis not present

## 2016-05-27 DIAGNOSIS — N390 Urinary tract infection, site not specified: Secondary | ICD-10-CM | POA: Diagnosis not present

## 2016-05-27 DIAGNOSIS — N920 Excessive and frequent menstruation with regular cycle: Secondary | ICD-10-CM | POA: Diagnosis not present

## 2016-05-27 MED FILL — NITROFURANTOIN MONO-MCR 100: 100 | 5 days supply | Qty: 10 | Fill #0

## 2016-06-02 DIAGNOSIS — R102 Pelvic and perineal pain: Secondary | ICD-10-CM | POA: Diagnosis not present

## 2016-06-02 DIAGNOSIS — N920 Excessive and frequent menstruation with regular cycle: Secondary | ICD-10-CM | POA: Diagnosis not present

## 2016-06-02 DIAGNOSIS — N800 Endometriosis of uterus: Secondary | ICD-10-CM | POA: Diagnosis not present

## 2016-06-02 DIAGNOSIS — E559 Vitamin D deficiency, unspecified: Secondary | ICD-10-CM | POA: Diagnosis not present

## 2016-06-02 DIAGNOSIS — N83209 Unspecified ovarian cyst, unspecified side: Secondary | ICD-10-CM | POA: Diagnosis not present

## 2016-06-10 ENCOUNTER — Other Ambulatory Visit: Payer: Self-pay | Admitting: Obstetrics and Gynecology

## 2016-06-10 DIAGNOSIS — N946 Dysmenorrhea, unspecified: Secondary | ICD-10-CM | POA: Diagnosis not present

## 2016-06-10 DIAGNOSIS — N858 Other specified noninflammatory disorders of uterus: Secondary | ICD-10-CM | POA: Diagnosis not present

## 2016-06-10 DIAGNOSIS — R42 Dizziness and giddiness: Secondary | ICD-10-CM | POA: Diagnosis not present

## 2016-06-10 DIAGNOSIS — N83202 Unspecified ovarian cyst, left side: Secondary | ICD-10-CM | POA: Diagnosis not present

## 2016-06-10 DIAGNOSIS — R102 Pelvic and perineal pain: Secondary | ICD-10-CM | POA: Diagnosis not present

## 2016-06-10 DIAGNOSIS — N920 Excessive and frequent menstruation with regular cycle: Secondary | ICD-10-CM | POA: Diagnosis not present

## 2016-06-10 DIAGNOSIS — N800 Endometriosis of uterus: Secondary | ICD-10-CM | POA: Diagnosis not present

## 2016-06-10 DIAGNOSIS — Z01411 Encounter for gynecological examination (general) (routine) with abnormal findings: Secondary | ICD-10-CM | POA: Diagnosis not present

## 2016-06-16 ENCOUNTER — Other Ambulatory Visit (HOSPITAL_COMMUNITY)
Admission: RE | Admit: 2016-06-16 | Discharge: 2016-06-16 | Disposition: A | Discharge status: Home / Self Care | Payer: 59 | Source: Ambulatory Visit | Attending: Obstetrics and Gynecology | Admitting: Obstetrics and Gynecology

## 2016-06-16 ENCOUNTER — Inpatient Hospital Stay (HOSPITAL_COMMUNITY): Admit: 2016-06-16 | Payer: 59

## 2016-06-16 DIAGNOSIS — N920 Excessive and frequent menstruation with regular cycle: Secondary | ICD-10-CM | POA: Diagnosis not present

## 2016-06-16 LAB — CBC
HCT: 35.1 % — ABNORMAL LOW (ref 36.0–46.0)
Hemoglobin: 11.9 g/dL — ABNORMAL LOW (ref 12.0–15.0)
MCH: 28.1 pg (ref 26.0–34.0)
MCHC: 33.9 g/dL (ref 30.0–36.0)
MCV: 82.8 fL (ref 78.0–100.0)
Platelets: 381 10*3/uL (ref 150–400)
RBC: 4.24 MIL/uL (ref 3.87–5.11)
RDW: 13.5 % (ref 11.5–15.5)
WBC: 12 10*3/uL — AB (ref 4.0–10.5)

## 2016-06-16 MED FILL — PROMETHAZINE 25 MG TABLET: 25 | 4 days supply | Qty: 14 | Fill #0

## 2016-06-16 MED FILL — MEDROXYPROGESTERONE 10 MG T: 10 | 30 days supply | Qty: 30 | Fill #0

## 2016-06-16 MED FILL — IBUPROFEN 800 MG TABLET: 800 | 10 days supply | Qty: 30 | Fill #0

## 2016-06-17 MED FILL — diazePAM 10 MG TABS: 10 | 1 days supply | Qty: 1 | Fill #0

## 2016-06-17 MED FILL — OXYCODONE W/APAP 5/325 TAB: 5-325 | 3 days supply | Qty: 20 | Fill #0

## 2016-06-19 ENCOUNTER — Other Ambulatory Visit: Payer: Self-pay | Admitting: Obstetrics and Gynecology

## 2016-06-19 DIAGNOSIS — N840 Polyp of corpus uteri: Secondary | ICD-10-CM | POA: Diagnosis not present

## 2016-06-19 DIAGNOSIS — N920 Excessive and frequent menstruation with regular cycle: Secondary | ICD-10-CM | POA: Diagnosis not present

## 2016-07-23 DIAGNOSIS — N760 Acute vaginitis: Secondary | ICD-10-CM | POA: Diagnosis not present

## 2016-07-23 DIAGNOSIS — N800 Endometriosis of uterus: Secondary | ICD-10-CM | POA: Diagnosis not present

## 2016-07-23 DIAGNOSIS — N930 Postcoital and contact bleeding: Secondary | ICD-10-CM | POA: Diagnosis not present

## 2016-07-23 DIAGNOSIS — R102 Pelvic and perineal pain: Secondary | ICD-10-CM | POA: Diagnosis not present

## 2016-07-23 DIAGNOSIS — N83202 Unspecified ovarian cyst, left side: Secondary | ICD-10-CM | POA: Diagnosis not present

## 2016-07-23 DIAGNOSIS — N83209 Unspecified ovarian cyst, unspecified side: Secondary | ICD-10-CM | POA: Diagnosis not present

## 2016-07-23 MED FILL — metroNIDAZOLE 500 MG TABS: 500 | 7 days supply | Qty: 14 | Fill #0

## 2016-07-23 MED FILL — NORETHINDRONE 5 MG TABLET: 5 | 90 days supply | Qty: 90 | Fill #0

## 2016-10-23 DIAGNOSIS — Z09 Encounter for follow-up examination after completed treatment for conditions other than malignant neoplasm: Secondary | ICD-10-CM | POA: Diagnosis not present

## 2016-10-23 DIAGNOSIS — N83209 Unspecified ovarian cyst, unspecified side: Secondary | ICD-10-CM | POA: Diagnosis not present

## 2016-10-23 DIAGNOSIS — N800 Endometriosis of uterus: Secondary | ICD-10-CM | POA: Diagnosis not present

## 2017-06-29 ENCOUNTER — Inpatient Hospital Stay (HOSPITAL_COMMUNITY)
Admission: AD | Admit: 2017-06-29 | Discharge: 2017-06-29 | Discharge status: Absent without leave | Payer: 59 | Attending: Obstetrics | Admitting: Obstetrics

## 2022-06-25 ENCOUNTER — Ambulatory Visit: Payer: Commercial Managed Care - PPO | Admitting: Nurse Practitioner

## 2022-06-25 ENCOUNTER — Encounter: Payer: Self-pay | Admitting: Nurse Practitioner

## 2022-06-25 VITALS — BP 135/91 | HR 79 | Temp 97.9°F

## 2022-06-25 DIAGNOSIS — J069 Acute upper respiratory infection, unspecified: Secondary | ICD-10-CM

## 2022-06-25 MED ORDER — BENZONATATE 100 MG PO CAPS: 100.0000 mg | ORAL_CAPSULE | Freq: Two times a day (BID) | ORAL | 0 refills | Status: AC | PRN

## 2022-06-25 MED ORDER — FLUTICASONE PROPIONATE 50 MCG/ACT NA SUSP: 2.0000 | Freq: Every day | NASAL | 6 refills | Status: AC

## 2022-06-25 NOTE — Progress Notes (Signed)
Acute Office Visit  Subjective:     Patient ID: Bianca Ortiz, female    DOB: 1985-11-29, 37 y.o.   MRN: [id number]  Chief Complaint  Patient presents with   Cough   Patient is in today for c/o cough and congestion.  Endorses cough with sputum production x 6 days. She reports waking up very congested in the mornings and with a mild sore throat. Also mentions some SOB with N95 mask use and sometimes when walking some distance. 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  Denies fevers, chills, muscle aches, or sinus pressure.   She admits that she had not tried anything over the counter to help symptoms. She tested negative for COVID this morning.   Also mentions that in December she was sick as well. She was treated with  azithromycin and prednisone dose pack. Symptoms improved after treatment.    Review of Systems  Constitutional:  Negative for chills, fever and malaise/fatigue.  HENT:  Positive for congestion and sore throat. Negative for ear pain, hearing loss and sinus pain.   Eyes: Negative.   Respiratory:  Positive for cough, sputum production, shortness of breath (with activity) and wheezing (reports wheezing mid chest when laying down).   Cardiovascular:  Negative for chest pain, palpitations and leg swelling.  Gastrointestinal:  Negative for constipation, diarrhea, nausea and vomiting.  Musculoskeletal:  Negative for myalgias.  Neurological:  Negative for dizziness and headaches.        Objective:    BP (!) 135/91   Pulse 79   Temp 97.9 F (36.6 C)   LMP 06/09/2022 (Exact Date)   SpO2 97%    Physical Exam Constitutional:      General: She is not in acute distress. HENT:     Head: Normocephalic.     Right Ear: Tympanic membrane, ear canal and external ear normal.     Left Ear: Tympanic membrane, ear canal and external ear normal.     Nose: Congestion present.     Right Sinus: No maxillary sinus tenderness or  frontal sinus tenderness.     Left Sinus: No maxillary sinus tenderness or frontal sinus tenderness.     Mouth/Throat:     Mouth: Mucous membranes are moist.     Pharynx: Posterior oropharyngeal erythema present. No oropharyngeal exudate.  Eyes:     Pupils: Pupils are equal, round, and reactive to light.  Cardiovascular:     Rate and Rhythm: Normal rate and regular rhythm.     Pulses: Normal pulses.     Heart sounds: Normal heart sounds.  Pulmonary:     Effort: Pulmonary effort is normal. No respiratory distress.     Breath sounds: Normal breath sounds. No wheezing.  Abdominal:     General: Abdomen is flat.     Palpations: Abdomen is soft.  Musculoskeletal:        General: Normal range of motion.     Cervical back: Normal range of motion.  Lymphadenopathy:     Cervical: No cervical adenopathy.  Skin:    General: Skin is warm.  Neurological:     General: No focal deficit present.     Mental Status: She is alert and oriented to person, place, and time.     No results found for any visits on 06/25/22.      Assessment & Plan:   Problem List Items Addressed This Visit   None Visit Diagnoses  Viral upper respiratory tract infection    -  Primary   Relevant Medications   benzonatate (TESSALON) 100 MG capsule   fluticasone (FLONASE) 50 MCG/ACT nasal spray     Vitals and physical exam are reassuring today. Due to length of symptoms, suspect viral in nature. Encouraged supportive care measures at home. Mucinex BID, increase water intake, and use of humidifier. Tessalon perles as needed for cough and flonase for congestion   Meds ordered this encounter  Medications   benzonatate (TESSALON) 100 MG capsule    Sig: Take 1 capsule (100 mg total) by mouth 2 (two) times daily as needed for cough.    Dispense:  20 capsule    Refill:  0   fluticasone (FLONASE) 50 MCG/ACT nasal spray    Sig: Place 2 sprays into both nostrils daily.    Dispense:  16 g    Refill:  6     Follow-up if symptoms worsen or fail to improve.   Lurena Joiner, NP
